# Patient Record
Sex: Male | Born: 1965 | Race: Black or African American | Hispanic: No | Marital: Single | Smoking: Never smoker
Health system: Southern US, Community
[De-identification: ages and names within clinical notes are randomized; demographics above are authoritative.]

## PROBLEM LIST (undated history)

## (undated) DIAGNOSIS — Z789 Other specified health status: Secondary | ICD-10-CM

## (undated) HISTORY — PX: KNEE ARTHROSCOPY: SHX127

---

## 1997-09-20 ENCOUNTER — Emergency Department (HOSPITAL_COMMUNITY): Admission: EM | Admit: 1997-09-20 | Discharge: 1997-09-20 | Payer: Self-pay | Admitting: Emergency Medicine

## 2003-09-23 ENCOUNTER — Emergency Department (HOSPITAL_COMMUNITY): Admission: EM | Admit: 2003-09-23 | Discharge: 2003-09-23 | Payer: Self-pay | Admitting: Emergency Medicine

## 2003-10-30 ENCOUNTER — Emergency Department (HOSPITAL_COMMUNITY): Admission: EM | Admit: 2003-10-30 | Discharge: 2003-10-30 | Payer: Self-pay | Admitting: Family Medicine

## 2015-12-05 DIAGNOSIS — G8929 Other chronic pain: Secondary | ICD-10-CM | POA: Insufficient documentation

## 2017-06-03 ENCOUNTER — Telehealth (INDEPENDENT_AMBULATORY_CARE_PROVIDER_SITE_OTHER): Payer: Self-pay | Admitting: Specialist

## 2017-06-03 NOTE — Telephone Encounter (Signed)
Yes.  Dr. Louanne Skye is out of the office till then.

## 2017-06-03 NOTE — Telephone Encounter (Signed)
Patient states he needed to be soon for his back, he hasnt been seen by another physician for it except he went to urgent care and they just told him his back "blew out" and he had X-rays done there and he said he would bring them. The first available we had was on Monday. Is this okay?

## 2017-06-07 ENCOUNTER — Ambulatory Visit (INDEPENDENT_AMBULATORY_CARE_PROVIDER_SITE_OTHER): Payer: Self-pay | Admitting: Specialist

## 2017-07-05 ENCOUNTER — Ambulatory Visit (INDEPENDENT_AMBULATORY_CARE_PROVIDER_SITE_OTHER): Payer: 59

## 2017-07-05 ENCOUNTER — Encounter (INDEPENDENT_AMBULATORY_CARE_PROVIDER_SITE_OTHER): Payer: Self-pay | Admitting: Specialist

## 2017-07-05 ENCOUNTER — Ambulatory Visit (INDEPENDENT_AMBULATORY_CARE_PROVIDER_SITE_OTHER): Payer: 59 | Admitting: Specialist

## 2017-07-05 VITALS — BP 113/71 | HR 83 | Ht 76.0 in | Wt 230.0 lb

## 2017-07-05 DIAGNOSIS — G8929 Other chronic pain: Secondary | ICD-10-CM | POA: Diagnosis not present

## 2017-07-05 DIAGNOSIS — M545 Low back pain: Secondary | ICD-10-CM

## 2017-07-05 DIAGNOSIS — D171 Benign lipomatous neoplasm of skin and subcutaneous tissue of trunk: Secondary | ICD-10-CM

## 2017-07-05 DIAGNOSIS — M5442 Lumbago with sciatica, left side: Secondary | ICD-10-CM | POA: Diagnosis not present

## 2017-07-05 MED ORDER — MELOXICAM 15 MG PO TABS: 15.0000 mg | ORAL_TABLET | Freq: Every day | ORAL | 6 refills | Status: DC

## 2017-07-05 MED ORDER — CYCLOBENZAPRINE HCL 10 MG PO TABS: 10.0000 mg | ORAL_TABLET | Freq: Three times a day (TID) | ORAL | 0 refills | Status: DC | PRN

## 2017-07-05 NOTE — Patient Instructions (Addendum)
Avoid frequent bending and stooping  No lifting greater than 10 lbs. May use ice or moist heat for pain. Weight loss is of benefit. Meloxicam for antiinflamatory affect for 2 weeks then as needed. Exercise regularly at least 10 min TID. Hold on bicycling for 6 weeks, start a walking program.

## 2017-07-05 NOTE — Progress Notes (Signed)
Office Visit Note   Patient: Billy Stevens           Date of Birth: 1965/07/11           MRN: [id number] Visit Date: 07/05/2017              Requested by: No referring provider defined for this encounter. PCP: Patient, No Pcp Per   Assessment & Plan: Visit Diagnoses:  1. Low back pain, unspecified back pain laterality, unspecified chronicity, with sciatica presence unspecified   2. Chronic bilateral low back pain with left-sided sciatica     Plan: Avoid frequent bending and stooping  No lifting greater than 10 lbs. May use ice or moist heat for pain. Weight loss is of benefit. Meloxicam for antiinflamatory affect for 2 weeks then as needed. Exercise regularly at least 10 min TID. Hold on bicycling for 6 weeks, start a walking program.  Follow-Up Instructions: Return in about 2 months (around 09/02/2017).   Orders:  Orders Placed This Encounter  Procedures  . XR Lumbar Spine 2-3 Views   No orders of the defined types were placed in this encounter.     Procedures: No procedures performed   Clinical Data: No additional findings.   Subjective: Chief Complaint  Patient presents with  . Lower Back - Follow-up    52 year old male with history of back pain and radiation into the right leg. He notes stiffness in the small of his back especially. He has had back go out in the past 2 months ago. He was kept out 3-4 days. Had pain in the back and into the back of his legs. He had got out from truck for the weekend and was going to do some yard work with his cousin. He bent over to pick up some markers or signs and with pain. This past December, with the snow he went to move the snow with his son and felt the pain into his back. Some times with pins and needles in the backs of the legs and into the bottom of the feet. No bowel or bladder difficulty. He can walk okay, in the am he has trouble standing on his foot where He had right foot surgery and right knee surgery  arthroscopy by [facility] Medicine with Dr. Alphonzo Cruise at least 6-7 years ago. 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 He has custody of his son 52 year old male.Numbness comes and goes. He is not sure he can walk a mile. He is grocery shopping. Sitting for periods increase the pain. Leaning over, stooping and bending increase the pain. He has been working out with his son, HS basketball college prospect.     Review of Systems  Constitutional: Positive for activity change and fever. Negative for chills, diaphoresis and unexpected weight change.  HENT: Negative.  Negative for congestion, dental problem, drooling, ear discharge, ear pain, facial swelling, hearing loss, mouth sores and nosebleeds.   Eyes: Negative.   Respiratory: Negative for cough, choking, chest tightness, shortness of breath and stridor.   Cardiovascular: Positive for leg swelling.  Gastrointestinal: Negative.  Negative for abdominal distention, abdominal pain and blood in stool.  Endocrine: Negative.  Negative for cold intolerance, heat intolerance, polydipsia, polyphagia and polyuria.  Genitourinary: Negative.  Negative for difficulty urinating, dysuria, enuresis, flank pain, frequency, genital sores and hematuria.  Musculoskeletal: Positive for arthralgias, back pain, gait problem and joint swelling. Negative for neck pain and neck stiffness.  Skin: Negative.  Negative for color change, pallor, rash and wound.  Allergic/Immunologic: Negative.  Negative for environmental allergies, food allergies and immunocompromised state.  Neurological: Positive for numbness. Negative for dizziness, tremors, seizures, syncope, facial asymmetry, weakness, light-headedness and headaches.  Hematological: Negative.  Negative for adenopathy. Does not bruise/bleed easily.  Psychiatric/Behavioral: Negative.  Negative for agitation, behavioral problems, confusion, decreased concentration, dysphoric mood, hallucinations,  self-injury, sleep disturbance and suicidal ideas. The patient is not nervous/anxious and is not hyperactive.      Objective: Vital Signs: BP 113/71 (BP Location: Left Arm, Patient Position: Sitting)   Pulse 83   Ht 6\' 4"  (1.93 m)   Wt 230 lb (104.3 kg)   BMI 28.00 kg/m   Physical Exam  Constitutional: He is oriented to person, place, and time. He appears well-developed and well-nourished.  HENT:  Head: Normocephalic and atraumatic.  Eyes: EOM are normal. Pupils are equal, round, and reactive to light.  Neck: Normal range of motion. Neck supple.  Pulmonary/Chest: Effort normal and breath sounds normal.  Abdominal: Soft. Bowel sounds are normal.  Neurological: He is alert and oriented to person, place, and time.  Skin: Skin is warm and dry.  Psychiatric: He has a normal mood and affect. His behavior is normal. Judgment and thought content normal.    Back Exam   Tenderness  The patient is experiencing tenderness in the lumbar.  Range of Motion  Flexion: 80  Lateral bend right: abnormal  Lateral bend left: abnormal  Rotation right: abnormal  Rotation left: abnormal   Muscle Strength  Right Quadriceps:  5/5  Left Quadriceps:  5/5  Right Hamstrings:  5/5  Left Hamstrings:  5/5   Tests  Straight leg raise right: negative Straight leg raise left: negative  Reflexes  Patellar:  Hyporeflexic normal Achilles: Hyporeflexic Babinski's sign: normal   Other  Toe walk: normal Heel walk: normal Erythema: no back redness Scars: absent  Comments:  Right paradorsal mass  6CM round, mobile and not fiixed or hard.       Specialty Comments:  No specialty comments available.  Imaging: Xr Lumbar Spine 2-3 Views  Result Date: 07/05/2017 AP and lateral flexion and extension radiographs with minimal degenerative disc narrowing L4-5. Only 4 lumbar vertebra with the transition without a disc space labeled S1-S2    PMFS History: There are no active problems to display for  this patient.  History reviewed. No pertinent past medical history.  History reviewed. No pertinent family history.  History reviewed. No pertinent surgical history. Social History   Occupational History  . Not on file  Tobacco Use  . Smoking status: Never Smoker  . Smokeless tobacco: Never Used  Substance and Sexual Activity  . Alcohol use: Not on file  . Drug use: Not on file  . Sexual activity: Not on file

## 2017-09-02 ENCOUNTER — Ambulatory Visit (INDEPENDENT_AMBULATORY_CARE_PROVIDER_SITE_OTHER): Payer: 59 | Admitting: Specialist

## 2017-09-20 ENCOUNTER — Other Ambulatory Visit (INDEPENDENT_AMBULATORY_CARE_PROVIDER_SITE_OTHER): Payer: Self-pay | Admitting: Specialist

## 2017-10-18 ENCOUNTER — Ambulatory Visit (INDEPENDENT_AMBULATORY_CARE_PROVIDER_SITE_OTHER): Payer: 59

## 2017-10-18 ENCOUNTER — Encounter (INDEPENDENT_AMBULATORY_CARE_PROVIDER_SITE_OTHER): Payer: Self-pay | Admitting: Specialist

## 2017-10-18 ENCOUNTER — Ambulatory Visit (INDEPENDENT_AMBULATORY_CARE_PROVIDER_SITE_OTHER): Payer: 59 | Admitting: Specialist

## 2017-10-18 VITALS — BP 114/76 | HR 58 | Ht 76.0 in | Wt 230.0 lb

## 2017-10-18 DIAGNOSIS — M222X1 Patellofemoral disorders, right knee: Secondary | ICD-10-CM

## 2017-10-18 DIAGNOSIS — M25561 Pain in right knee: Secondary | ICD-10-CM

## 2017-10-18 MED ORDER — DICLOFENAC SODIUM 1 % TD GEL: 4.0000 g | Freq: Four times a day (QID) | TRANSDERMAL | 2 refills | Status: DC

## 2017-10-18 NOTE — Progress Notes (Signed)
Office Visit Note   Patient: Billy Stevens           Date of Birth: 05-15-1966           MRN: [id number] Visit Date: 10/18/2017              Requested by: No referring provider defined for this encounter. PCP: Patient, No Pcp Per   Assessment & Plan: Visit Diagnoses:  1. Right knee pain, unspecified chronicity     Plan: Knee is suffering from osteoarthritis, only real proven treatments are Weight loss, NSIADs like meloxicam,  Diclofenac gel and exercise. Well padded shoes help. Ice the knee 2-3 times a day 15-20 mins at a time.  Follow-Up Instructions: No follow-ups on file.   Orders:  Orders Placed This Encounter  Procedures  . XR Knee 1-2 Views Right   No orders of the defined types were placed in this encounter.     Procedures: No procedures performed   Clinical Data: No additional findings.   Subjective: Chief Complaint  Patient presents with  . Lower Back - Follow-up  . Right Knee - Pain    52 year old male with history of previous right knee arthroscopy by [facility], Dr Durward Fortes when working at [employer] about 10-15 years ago. The foot and right ankle is swollen also. He was seen with back pain and started on meloxicam and flexeril. The right knee is painful to bend and squatting and stair climbing. There is decreased bending on the right knee and it is swollen.    Review of Systems  Constitutional: Negative.   HENT: Negative.   Eyes: Negative.   Respiratory: Negative.   Cardiovascular: Negative.   Gastrointestinal: Negative.   Endocrine: Negative.   Genitourinary: Negative.   Musculoskeletal: Negative.   Skin: Negative.   Allergic/Immunologic: Negative.   Neurological: Negative.   Hematological: Negative.   Psychiatric/Behavioral: Negative.      Objective: Vital Signs: BP 114/76 (BP Location: Left Arm, Patient Position: Sitting)   Pulse (!) 58   Ht 6\' 4"  (1.93 m)   Wt 230 lb (104.3 kg)   BMI 28.00 kg/m   Physical Exam    Constitutional: He is oriented to person, place, and time. He appears well-developed and well-nourished.  HENT:  Head: Normocephalic and atraumatic.  Eyes: Pupils are equal, round, and reactive to light. EOM are normal.  Neck: Normal range of motion. Neck supple.  Pulmonary/Chest: Effort normal and breath sounds normal.  Abdominal: Soft. Bowel sounds are normal.  Musculoskeletal:       Right knee: He exhibits no effusion.  Neurological: He is alert and oriented to person, place, and time.  Skin: Skin is warm and dry.  Psychiatric: He has a normal mood and affect. His behavior is normal. Judgment and thought content normal.    Right Knee Exam   Muscle Strength  The patient has normal right knee strength.  Tenderness  The patient is experiencing tenderness in the patella.  Range of Motion  Extension:  -5 abnormal  Flexion: 130   Tests  McMurray:  Medial - negative Lateral - negative Varus: negative Valgus: negative Lachman:  Anterior - negative    Posterior - negative Drawer:  Anterior - negative    Posterior - negative Pivot shift: negative Patellar apprehension: negative  Other  Erythema: absent Scars: present Sensation: normal Pulse: present Swelling: mild Effusion: no effusion present  Comments:  Right knee 1/4 inch greater in circumference  Specialty Comments:  No specialty comments available.  Imaging: No results found.   PMFS History: There are no active problems to display for this patient.  History reviewed. No pertinent past medical history.  History reviewed. No pertinent family history.  History reviewed. No pertinent surgical history. Social History   Occupational History  . Not on file  Tobacco Use  . Smoking status: Never Smoker  . Smokeless tobacco: Never Used  Substance and Sexual Activity  . Alcohol use: Not on file  . Drug use: Not on file  . Sexual activity: Not on file

## 2017-10-18 NOTE — Patient Instructions (Signed)
  Knee is suffering from osteoarthritis, only real proven treatments are Weight loss, NSIADs like meloxicam,  Diclofenac gel and exercise. Well padded shoes help. Ice the knee 2-3 times a day 15-20 mins at a time.

## 2018-01-03 ENCOUNTER — Ambulatory Visit (INDEPENDENT_AMBULATORY_CARE_PROVIDER_SITE_OTHER): Payer: 59 | Admitting: Specialist

## 2018-02-14 ENCOUNTER — Ambulatory Visit (INDEPENDENT_AMBULATORY_CARE_PROVIDER_SITE_OTHER): Payer: 59 | Admitting: Specialist

## 2018-02-14 ENCOUNTER — Encounter (INDEPENDENT_AMBULATORY_CARE_PROVIDER_SITE_OTHER): Payer: Self-pay | Admitting: Specialist

## 2018-02-14 VITALS — BP 130/88 | HR 81 | Ht 76.0 in | Wt 230.0 lb

## 2018-02-14 DIAGNOSIS — M1711 Unilateral primary osteoarthritis, right knee: Secondary | ICD-10-CM

## 2018-02-14 NOTE — Patient Instructions (Signed)
The main ways of treat osteoarthritis, that are found to be success. Weight loss helps to decrease pain. Exercise is important to maintaining cartilage and thickness and strengthening. NSAIDs like motrin, tylenol, alleve are meds decreasing the inflamation. Ice is okay  In afternoon and evening and hot shower in the am. Straight leg exercises, leg raising.

## 2018-02-14 NOTE — Progress Notes (Signed)
Office Visit Note   Patient: Billy Stevens           Date of Birth: 04-Apr-1966           MRN: [id number] Visit Date: 02/14/2018              Requested by: No referring provider defined for this encounter. PCP: Patient, No Pcp Per   Assessment & Plan: Visit Diagnoses:  1. Patellofemoral arthritis of right knee     Plan:The main ways of treat osteoarthritis, that are found to be success. Weight loss helps to decrease pain. Exercise is important to maintaining cartilage and thickness and strengthening. NSAIDs like motrin, tylenol, alleve are meds decreasing the inflamation. Ice is okay  In afternoon and evening and hot shower in the am. Straight leg exercises, leg raising.   Follow-Up Instructions: Return if symptoms worsen or fail to improve.   Orders:  No orders of the defined types were placed in this encounter.  No orders of the defined types were placed in this encounter.     Procedures: No procedures performed   Clinical Data: No additional findings.   Subjective: Chief Complaint  Patient presents with  . Right Knee - Follow-up    HPI  Review of Systems  Constitutional: Negative.   HENT: Negative.   Eyes: Negative.   Respiratory: Negative.   Cardiovascular: Negative.   Gastrointestinal: Negative.   Endocrine: Negative.   Genitourinary: Negative.   Musculoskeletal: Negative.   Skin: Negative.   Allergic/Immunologic: Negative.   Neurological: Negative.   Hematological: Negative.   Psychiatric/Behavioral: Negative.      Objective: Vital Signs: BP 130/88 (BP Location: Left Arm, Patient Position: Sitting)   Pulse 81   Ht 6\' 4"  (1.93 m)   Wt 230 lb (104.3 kg)   BMI 28.00 kg/m   Physical Exam  Constitutional: He is oriented to person, place, and time. He appears well-developed and well-nourished.  HENT:  Head: Normocephalic and atraumatic.  Eyes: Pupils are equal, round, and reactive to light. EOM are normal.  Neck: Normal range of  motion. Neck supple.  Pulmonary/Chest: Effort normal and breath sounds normal.  Abdominal: Soft. Bowel sounds are normal.  Musculoskeletal: Normal range of motion.  Neurological: He is alert and oriented to person, place, and time.  Skin: Skin is warm and dry.  Psychiatric: He has a normal mood and affect. His behavior is normal. Judgment and thought content normal.    Right Knee Exam   Tenderness  The patient is experiencing no tenderness.   Range of Motion  Extension: normal  Flexion: normal   Tests  McMurray:  Medial - negative Lateral - negative Varus: negative Valgus: negative Lachman:  Anterior - negative    Posterior - negative Drawer:  Anterior - negative    Posterior - negative Pivot shift: negative Patellar apprehension: negative  Other  Erythema: absent Scars: absent Sensation: normal Pulse: present Swelling: none      Specialty Comments:  No specialty comments available.  Imaging: No results found.   PMFS History: There are no active problems to display for this patient.  No past medical history on file.  No family history on file.  No past surgical history on file. Social History   Occupational History  . Not on file  Tobacco Use  . Smoking status: Never Smoker  . Smokeless tobacco: Never Used  Substance and Sexual Activity  . Alcohol use: Not on file  . Drug use: Not on file  .  Sexual activity: Not on file

## 2019-05-22 ENCOUNTER — Emergency Department (HOSPITAL_COMMUNITY)
Admission: EM | Admit: 2019-05-22 | Discharge: 2019-05-22 | Disposition: A | Discharge status: Home / Self Care | Payer: No Typology Code available for payment source | Attending: Emergency Medicine | Admitting: Emergency Medicine

## 2019-05-22 ENCOUNTER — Ambulatory Visit: Payer: Self-pay | Admitting: Surgical

## 2019-05-22 ENCOUNTER — Emergency Department (HOSPITAL_COMMUNITY): Payer: No Typology Code available for payment source

## 2019-05-22 ENCOUNTER — Encounter (HOSPITAL_COMMUNITY): Payer: Self-pay | Admitting: Emergency Medicine

## 2019-05-22 ENCOUNTER — Other Ambulatory Visit: Payer: Self-pay

## 2019-05-22 DIAGNOSIS — M5442 Lumbago with sciatica, left side: Secondary | ICD-10-CM | POA: Insufficient documentation

## 2019-05-22 DIAGNOSIS — M5441 Lumbago with sciatica, right side: Secondary | ICD-10-CM | POA: Insufficient documentation

## 2019-05-22 DIAGNOSIS — M5431 Sciatica, right side: Secondary | ICD-10-CM

## 2019-05-22 DIAGNOSIS — M5432 Sciatica, left side: Secondary | ICD-10-CM

## 2019-05-22 DIAGNOSIS — Z79899 Other long term (current) drug therapy: Secondary | ICD-10-CM | POA: Insufficient documentation

## 2019-05-22 DIAGNOSIS — M545 Low back pain: Secondary | ICD-10-CM | POA: Diagnosis present

## 2019-05-22 MED ORDER — KETAMINE HCL 10 MG/ML IJ SOLN
0.3000 mg/kg | Freq: Once | INTRAMUSCULAR | Status: AC
  Administered 2019-05-22: 30 mg via INTRAVENOUS
  Filled 2019-05-22: qty 1

## 2019-05-22 MED ORDER — DEXAMETHASONE SODIUM PHOSPHATE 10 MG/ML IJ SOLN
10.0000 mg | Freq: Once | INTRAMUSCULAR | Status: AC
  Administered 2019-05-22: 11:00:00 10 mg via INTRAMUSCULAR
  Filled 2019-05-22: qty 1

## 2019-05-22 MED ORDER — HYDROMORPHONE HCL 1 MG/ML IJ SOLN
1.0000 mg | Freq: Once | INTRAMUSCULAR | Status: AC
  Administered 2019-05-22: 1 mg via INTRAVENOUS
  Filled 2019-05-22: qty 1

## 2019-05-22 MED ORDER — HYDROMORPHONE HCL 1 MG/ML IJ SOLN: 1.0000 mg | Freq: Once | INTRAMUSCULAR | Status: DC

## 2019-05-22 MED ORDER — ONDANSETRON 4 MG PO TBDP
4.0000 mg | ORAL_TABLET | Freq: Once | ORAL | Status: AC
  Administered 2019-05-22: 4 mg via ORAL
  Filled 2019-05-22: qty 1

## 2019-05-22 MED ORDER — METHOCARBAMOL 500 MG PO TABS
750.0000 mg | ORAL_TABLET | Freq: Once | ORAL | Status: AC
  Administered 2019-05-22: 750 mg via ORAL
  Filled 2019-05-22: qty 2

## 2019-05-22 MED ORDER — ONDANSETRON HCL 4 MG/2ML IJ SOLN
4.0000 mg | Freq: Once | INTRAMUSCULAR | Status: AC
  Administered 2019-05-22: 4 mg via INTRAVENOUS
  Filled 2019-05-22: qty 2

## 2019-05-22 MED ORDER — METHOCARBAMOL 750 MG PO TABS: 750.0000 mg | ORAL_TABLET | Freq: Three times a day (TID) | ORAL | 0 refills | Status: DC

## 2019-05-22 MED ORDER — HYDROCODONE-ACETAMINOPHEN 5-325 MG PO TABS: 1.0000 | ORAL_TABLET | ORAL | 0 refills | Status: DC | PRN

## 2019-05-22 MED ORDER — PREDNISONE 10 MG PO TABS: ORAL_TABLET | ORAL | 0 refills | Status: DC

## 2019-05-22 MED ORDER — HYDROMORPHONE HCL 1 MG/ML IJ SOLN
1.0000 mg | Freq: Once | INTRAMUSCULAR | Status: AC
  Administered 2019-05-22: 1 mg via INTRAMUSCULAR
  Filled 2019-05-22: qty 1

## 2019-05-22 MED ORDER — ONDANSETRON 4 MG PO TBDP: 4.0000 mg | ORAL_TABLET | Freq: Three times a day (TID) | ORAL | 0 refills | Status: DC | PRN

## 2019-05-22 MED ORDER — METOCLOPRAMIDE HCL 5 MG/ML IJ SOLN
5.0000 mg | Freq: Once | INTRAMUSCULAR | Status: AC
  Administered 2019-05-22: 5 mg via INTRAVENOUS
  Filled 2019-05-22: qty 2

## 2019-05-22 MED ORDER — KETOROLAC TROMETHAMINE 30 MG/ML IJ SOLN
15.0000 mg | Freq: Once | INTRAMUSCULAR | Status: AC
  Administered 2019-05-22: 15 mg via INTRAVENOUS
  Filled 2019-05-22: qty 1

## 2019-05-22 NOTE — ED Triage Notes (Addendum)
Pt brought in via [facility]. Pt reports was at work yesterday when truck began "slipping". Pt reports "went to work on it" and reports back pain ever since. Pt reports "I am not sure if I pulled something." pt reports history of back "swelling". Pt denies any gi/gu, extremity numbness at this time. Pt reports pain with movement and ambulation. nad noted.   Pt reports had appointment with ortho at 1030 but reports pain increased and reports difficulty ambulating and pt family called EMS.

## 2019-05-22 NOTE — ED Notes (Signed)
Patient told RN that he just remembered he is allergic to oxycodone.  Med added to allergy list.

## 2019-05-22 NOTE — ED Notes (Signed)
Patient transported to X-ray 
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
## 2019-05-22 NOTE — Discharge Instructions (Addendum)
Take your next dose of prednisone tomorrow morning.  Use the the other medicines as directed.  Do not drive within 4 hours of taking hydrocodone as this will make you drowsy.  Avoid lifting,  Bending,  Twisting or any other activity that worsens your pain over the next week.  Apply a heating pad to your lower back 3-4 times daily for 20 minutes.  Follow up for a recheck in 4 days as planned.  You have also been prescribed zofran which is a nausea medication.  You may use this if your pain medicine causes nausea for you.

## 2019-06-09 DIAGNOSIS — M545 Low back pain, unspecified: Secondary | ICD-10-CM | POA: Insufficient documentation

## 2019-09-07 ENCOUNTER — Ambulatory Visit: Payer: Managed Care, Other (non HMO)

## 2019-09-09 ENCOUNTER — Ambulatory Visit: Payer: Managed Care, Other (non HMO) | Attending: Internal Medicine

## 2019-09-09 DIAGNOSIS — Z23 Encounter for immunization: Secondary | ICD-10-CM

## 2019-09-09 NOTE — Progress Notes (Signed)
   Covid-19 Vaccination Clinic  Name:  Billy Stevens    MRN: [id number] DOB: 24-Nov-1965  09/09/2019  Billy Stevens was observed post Covid-19 immunization for 15 minutes without incident. He was provided with Vaccine Information Sheet and instruction to access the V-Safe system.   Billy Stevens was instructed to call 911 with any severe reactions post vaccine: Marland Kitchen Difficulty breathing  . Swelling of face and throat  . A fast heartbeat  . A bad rash all over body  . Dizziness and weakness   Immunizations Administered    Name Date Dose VIS Date Route   Pfizer COVID-19 Vaccine 09/09/2019  9:02 AM 0.3 mL 07/12/2018 Intramuscular   Manufacturer: [place]   Lot: [id number]   [place]: [id number]

## 2019-10-07 ENCOUNTER — Ambulatory Visit: Payer: Managed Care, Other (non HMO) | Attending: Internal Medicine

## 2019-10-07 DIAGNOSIS — Z23 Encounter for immunization: Secondary | ICD-10-CM

## 2019-10-07 NOTE — Progress Notes (Signed)
   Covid-19 Vaccination Clinic  Name:  Billy Stevens    MRN: [id number] DOB: 20-Sep-1965  10/07/2019  Mr. Billy Stevens was observed post Covid-19 immunization for 15 minutes without incident. He was provided with Vaccine Information Sheet and instruction to access the V-Safe system.   Mr. Billy Stevens was instructed to call 911 with any severe reactions post vaccine: Marland Kitchen Difficulty breathing  . Swelling of face and throat  . A fast heartbeat  . A bad rash all over body  . Dizziness and weakness   Immunizations Administered    Name Date Dose VIS Date Route   Pfizer COVID-19 Vaccine 10/07/2019  9:01 AM 0.3 mL 07/12/2018 Intramuscular   Manufacturer: [employer]   Lot: [id number]   [place]: [id number]

## 2021-02-18 IMAGING — DX DG LUMBAR SPINE COMPLETE 4+V
5 series · 5 of 5 positions shown · non-contrast
Comparison: 07/05/2017

CLINICAL DATA: Low back pain

EXAM:
LUMBAR SPINE - COMPLETE 4+ VIEW

[l-spine ap]
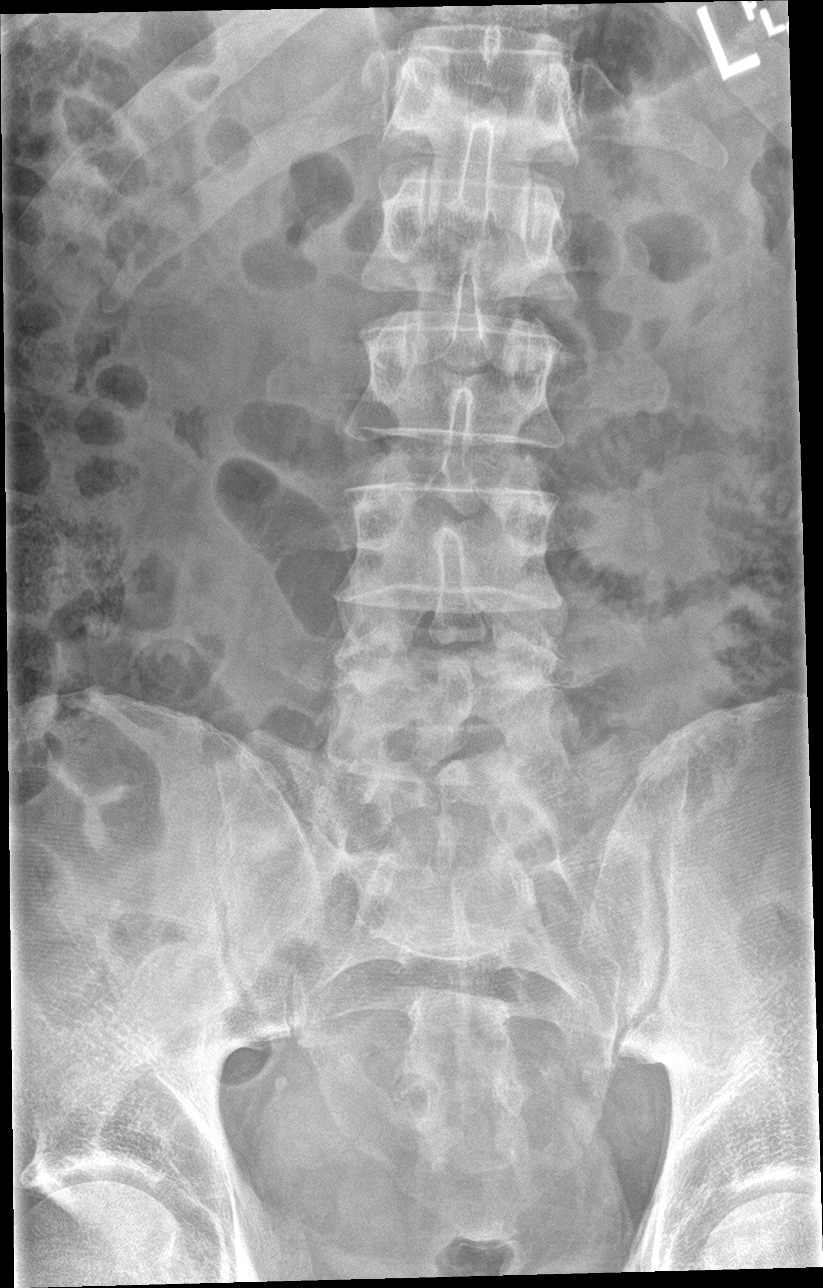

[l-spine obl (1 of 2)]
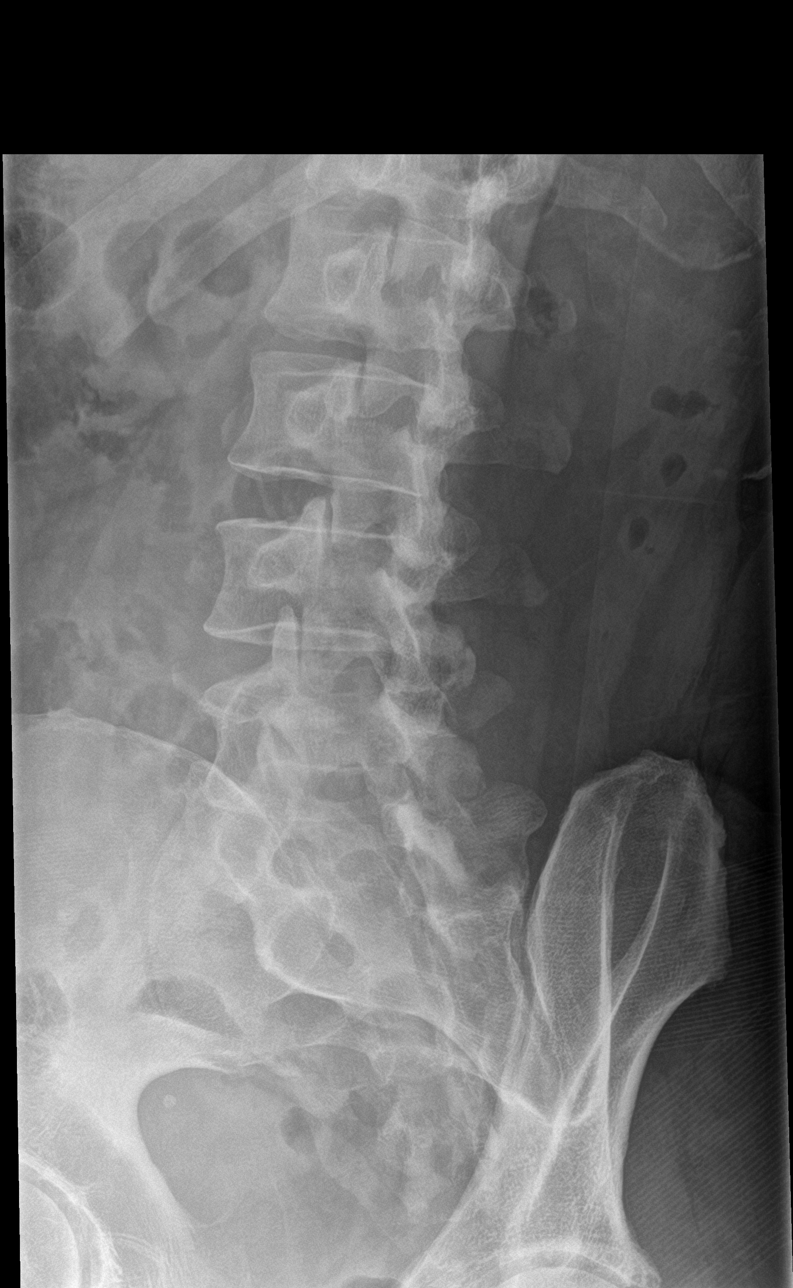

[l-spine lat]
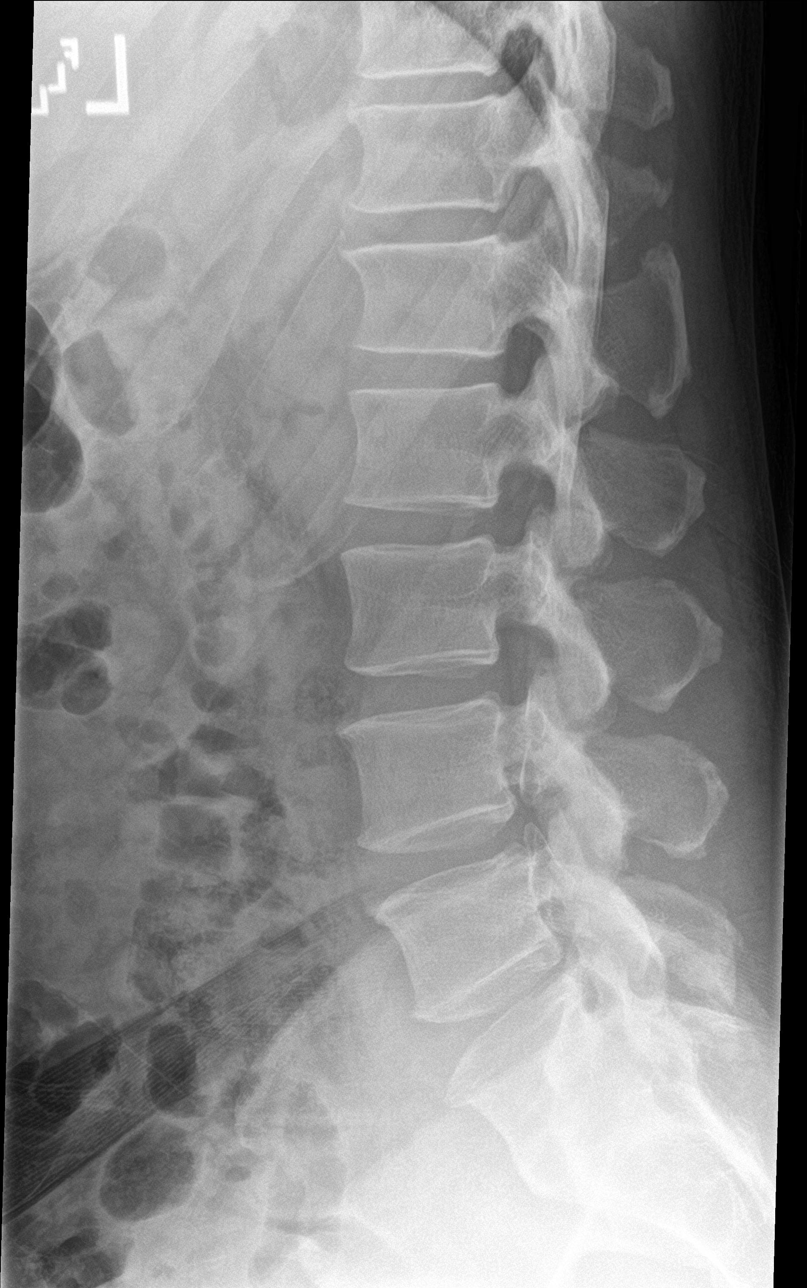

[l-spine spot]
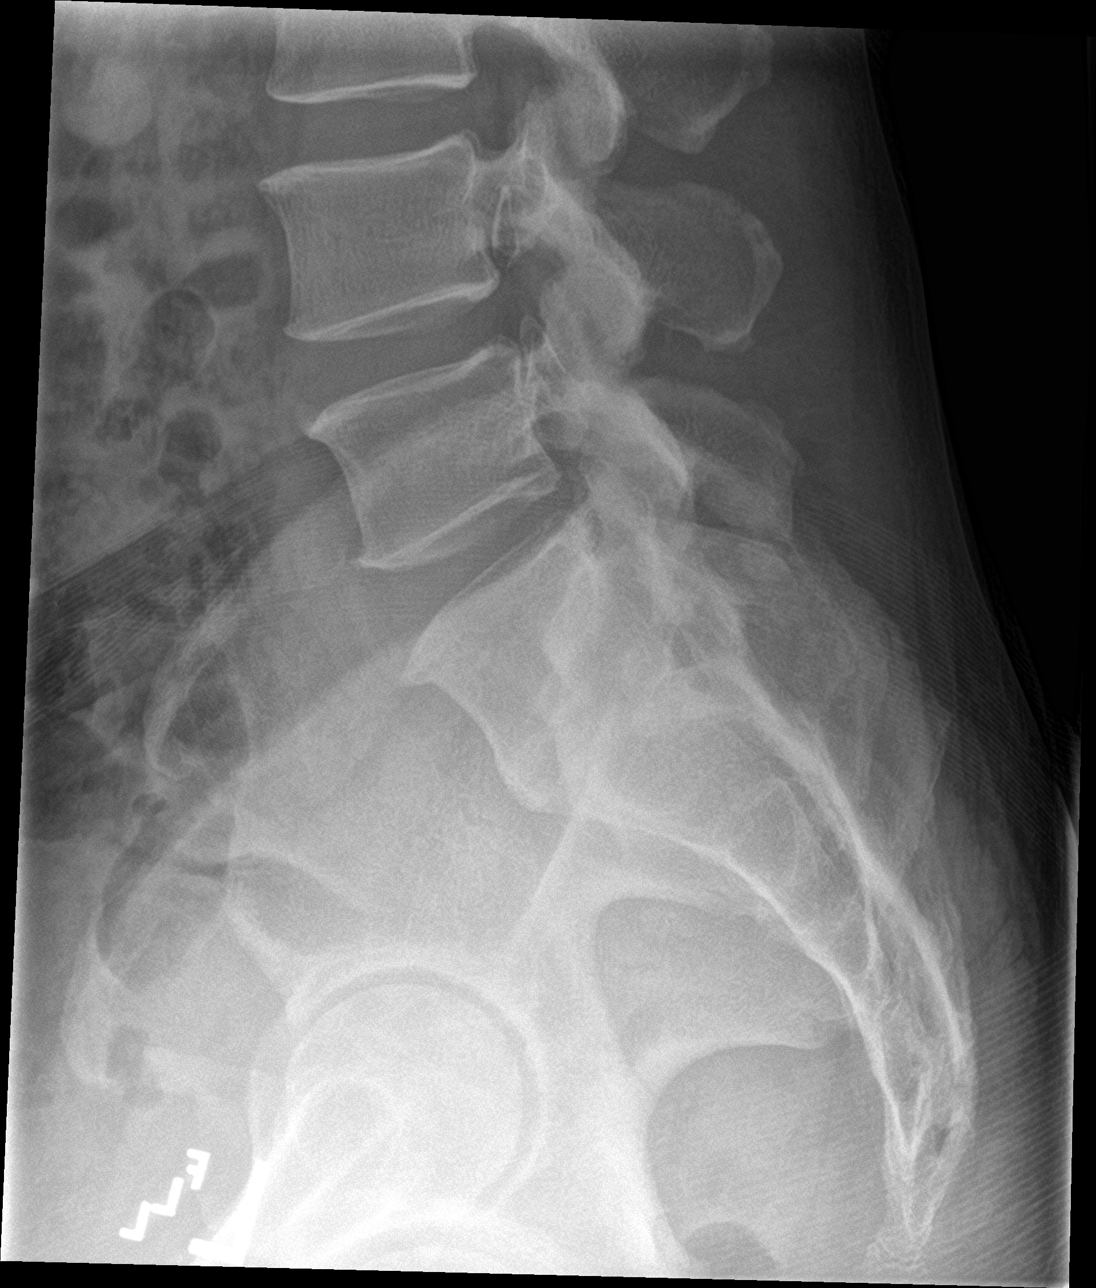

[l-spine obl (2 of 2)]
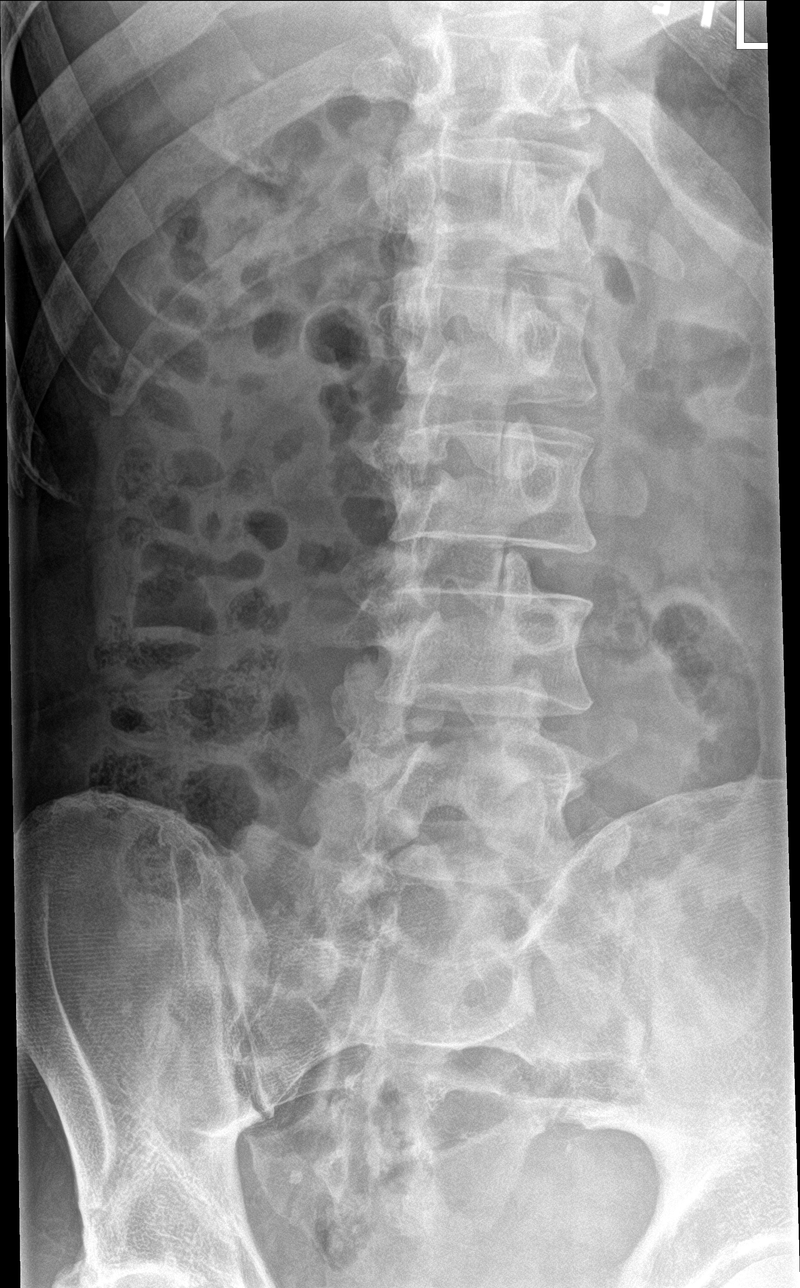

[5 of 5 positions shown; findings below may reference images not displayed]

FINDINGS: Transitional lumbosacral anatomy with sacralization of the L5
segment. Intervertebral disc spaces are preserved. Minimal
degenerative endplate spurring. Mild lower lumbar facet arthrosis
without evidence of significant progression from the prior study.
Vertebral body heights are maintained without evidence of fracture.
IMPRESSION: 1. No acute findings.
2. Transitional lumbosacral anatomy with sacralization of the L5
segment.
3. Mild lower lumbar facet arthrosis.

## 2021-05-28 ENCOUNTER — Other Ambulatory Visit (HOSPITAL_BASED_OUTPATIENT_CLINIC_OR_DEPARTMENT_OTHER): Payer: Self-pay

## 2021-11-13 ENCOUNTER — Ambulatory Visit: Payer: Self-pay | Admitting: Orthopedic Surgery

## 2021-11-21 NOTE — Pre-Procedure Instructions (Signed)
Surgical Instructions    Your procedure is scheduled on Thursday, July 13th.  Report to [facility] Main Entrance "A" at 5:30 A.M., then check in with the Admitting office.  Call this number if you have problems the morning of surgery:  [phone]   If you have any questions prior to your surgery date call [phone]: Open Monday-Friday 8am-4pm    Remember:  Do not eat after midnight the night before your surgery  You may drink clear liquids until 4:30 a.m. the morning of your surgery.   Clear liquids allowed are: Water, Non-Citrus Juices (without pulp), Carbonated Beverages, Clear Tea, Black Coffee Only (NO MILK, CREAM OR POWDERED CREAMER of any kind), and Gatorade.    Take these medicines the morning of surgery with A SIP OF WATER: NONE  As of today, STOP taking any Aspirin (unless otherwise instructed by your surgeon) Aleve, Naproxen, Ibuprofen, Motrin, Advil, Goody's, BC's, all herbal medications, fish oil, and all vitamins. This includes: meloxicam (MOBIC).                     Do NOT Smoke (Tobacco/Vaping) for 24 hours prior to your procedure.  If you use a CPAP at night, you may bring your mask/headgear for your overnight stay.   Contacts, glasses, piercing's, hearing aid's, dentures or partials may not be worn into surgery, please bring cases for these belongings.    For patients admitted to the hospital, discharge time will be determined by your treatment team.   Patients discharged the day of surgery will not be allowed to drive home, and someone needs to stay with them for 24 hours.  SURGICAL WAITING ROOM VISITATION Patients having surgery or a procedure may have two support people in the waiting room. These visitors may be switched out with other visitors if needed. Children under the age of 20 must have an adult accompany them who is not the patient. If the patient needs to stay at the hospital during part of their recovery, the visitor guidelines for inpatient rooms  apply.  Please refer to the [facility] website for the visitor guidelines for Inpatients (after your surgery is over and you are in a regular room).    Special instructions:   [place]- Preparing For Surgery  Before surgery, you can play an important role. Because skin is not sterile, your skin needs to be as free of germs as possible. You can reduce the number of germs on your skin by washing with CHG (chlorahexidine gluconate) Soap before surgery.  CHG is an antiseptic cleaner which kills germs and bonds with the skin to continue killing germs even after washing.    Oral Hygiene is also important to reduce your risk of infection.  Remember - BRUSH YOUR TEETH THE MORNING OF SURGERY WITH YOUR REGULAR TOOTHPASTE  Please do not use if you have an allergy to CHG or antibacterial soaps. If your skin becomes reddened/irritated stop using the CHG.  Do not shave (including legs and underarms) for at least 48 hours prior to first CHG shower. It is OK to shave your face.  Please follow these instructions carefully.   Shower the NIGHT BEFORE SURGERY and the MORNING OF SURGERY  If you chose to wash your hair, wash your hair first as usual with your normal shampoo.  After you shampoo, rinse your hair and body thoroughly to remove the shampoo.  Use CHG Soap as you would any other liquid soap. You can apply CHG directly to the skin  and wash gently with a scrungie or a clean washcloth.   Apply the CHG Soap to your body ONLY FROM THE NECK DOWN.  Do not use on open wounds or open sores. Avoid contact with your eyes, ears, mouth and genitals (private parts). Wash Face and genitals (private parts)  with your normal soap.   Wash thoroughly, paying special attention to the area where your surgery will be performed.  Thoroughly rinse your body with warm water from the neck down.  DO NOT shower/wash with your normal soap after using and rinsing off the CHG Soap.  Pat yourself dry with a CLEAN  TOWEL.  Wear CLEAN PAJAMAS to bed the night before surgery  Place CLEAN SHEETS on your bed the night before your surgery  DO NOT SLEEP WITH PETS.   Day of Surgery: Take a shower with CHG soap. Do not wear jewelry or makeup Do not wear lotions, powders, colognes, or deodorant. Men may shave face and neck. Do not bring valuables to the hospital.  [facility] is not responsible for any belongings or valuables. Wear Clean/Comfortable clothing the morning of surgery Remember to brush your teeth WITH YOUR REGULAR TOOTHPASTE.   Please read over the following fact sheets that you were given.    If you received a COVID test during your pre-op visit  it is requested that you wear a mask when out in public, stay away from anyone that may not be feeling well and notify your surgeon if you develop symptoms. If you have been in contact with anyone that has tested positive in the last 10 days please notify you surgeon.

## 2021-11-24 ENCOUNTER — Encounter (HOSPITAL_COMMUNITY): Payer: Self-pay

## 2021-11-24 ENCOUNTER — Encounter (HOSPITAL_COMMUNITY)
Admission: RE | Admit: 2021-11-24 | Discharge: 2021-11-24 | Disposition: A | Discharge status: Home / Self Care | Payer: Managed Care, Other (non HMO) | Source: Ambulatory Visit | Attending: Orthopedic Surgery | Admitting: Orthopedic Surgery

## 2021-11-24 ENCOUNTER — Other Ambulatory Visit: Payer: Self-pay

## 2021-11-24 VITALS — BP 145/90 | HR 75 | Temp 98.1°F | Resp 17 | Ht 76.0 in | Wt 242.0 lb

## 2021-11-24 DIAGNOSIS — Z01818 Encounter for other preprocedural examination: Secondary | ICD-10-CM

## 2021-11-24 DIAGNOSIS — Z01812 Encounter for preprocedural laboratory examination: Secondary | ICD-10-CM | POA: Diagnosis not present

## 2021-11-24 HISTORY — DX: Other specified health status: Z78.9

## 2021-11-24 LAB — CBC
HCT: 42.5 % (ref 39.0–52.0)
Hemoglobin: 13.6 g/dL (ref 13.0–17.0)
MCH: 27.4 pg (ref 26.0–34.0)
MCHC: 32 g/dL (ref 30.0–36.0)
MCV: 85.7 fL (ref 80.0–100.0)
Platelets: 306 10*3/uL (ref 150–400)
RBC: 4.96 MIL/uL (ref 4.22–5.81)
RDW: 13.8 % (ref 11.5–15.5)
WBC: 4.9 10*3/uL (ref 4.0–10.5)
nRBC: 0 % (ref 0.0–0.2)

## 2021-11-24 LAB — SURGICAL PCR SCREEN
MRSA, PCR: NEGATIVE
Staphylococcus aureus: NEGATIVE

## 2021-11-24 NOTE — Progress Notes (Signed)
PCP - Everardo Beals, NP Cardiologist - Denies  PPM/ICD - Denies Device Orders - n/a Rep Notified - n/a  Chest x-ray - Denies EKG - Denies Stress Test - Denies ECHO - Denies Cardiac Cath - Denies  Sleep Study - Denies CPAP - n/a  No DM  Blood Thinner Instructions: n/a Aspirin Instructions: n/a  ERAS Protcol - may have clear liquids until 0430 the morning of surgery PRE-SURGERY Ensure or G2- none ordered  COVID TEST- n/a   Anesthesia review: No   Patient denies shortness of breath, fever, cough and chest pain at PAT appointment   All instructions explained to the patient, with a verbal understanding of the material. Patient agrees to go over the instructions while at home for a better understanding. Patient also instructed to self quarantine after being tested for COVID-19. The opportunity to ask questions was provided.

## 2021-11-26 NOTE — Anesthesia Preprocedure Evaluation (Addendum)
Anesthesia Evaluation  Patient identified by MRN, date of birth, ID band Patient awake    Reviewed: Allergy & Precautions, NPO status , Patient's Chart, lab work & pertinent test results  Airway Mallampati: II  TM Distance: >3 FB Neck ROM: Full    Dental no notable dental hx. (+) Teeth Intact, Dental Advisory Given   Pulmonary    Pulmonary exam normal breath sounds clear to auscultation       Cardiovascular Exercise Tolerance: Good Normal cardiovascular exam Rhythm:Regular Rate:Normal     Neuro/Psych negative neurological ROS     GI/Hepatic negative GI ROS, Neg liver ROS,   Endo/Other    Renal/GU negative Renal ROS     Musculoskeletal   Abdominal   Peds  Hematology Lab Results      Component                Value               Date                      WBC                      4.9                 11/24/2021                HGB                      13.6                11/24/2021                HCT                      42.5                11/24/2021                MCV                      85.7                11/24/2021                PLT                      306                 11/24/2021              Anesthesia Other Findings ALL: oxycodone  Reproductive/Obstetrics                            Anesthesia Physical Anesthesia Plan  ASA: 1  Anesthesia Plan: General   Post-op Pain Management: Ketamine IV*, Gabapentin PO (pre-op)*, Dilaudid IV and Tylenol PO (pre-op)*   Induction: Intravenous  PONV Risk Score and Plan: 3 and Treatment may vary due to age or medical condition, Midazolam and Ondansetron  Airway Management Planned: Oral ETT  Additional Equipment: None  Intra-op Plan:   Post-operative Plan: Extubation in OR  Informed Consent: I have reviewed the patients History and Physical, chart, labs and discussed the procedure including the risks, benefits and alternatives for the  proposed anesthesia with the patient or authorized representative who has  indicated his/her understanding and acceptance.     Dental advisory given  Plan Discussed with:   Anesthesia Plan Comments:        Anesthesia Quick Evaluation

## 2021-11-27 ENCOUNTER — Other Ambulatory Visit: Payer: Self-pay

## 2021-11-27 ENCOUNTER — Ambulatory Visit (HOSPITAL_BASED_OUTPATIENT_CLINIC_OR_DEPARTMENT_OTHER): Payer: Managed Care, Other (non HMO) | Admitting: Certified Registered"

## 2021-11-27 ENCOUNTER — Ambulatory Visit (HOSPITAL_COMMUNITY)
Admission: RE | Admit: 2021-11-27 | Discharge: 2021-11-27 | Disposition: A | Discharge status: Home / Self Care | Payer: Managed Care, Other (non HMO) | Attending: Orthopedic Surgery | Admitting: Orthopedic Surgery

## 2021-11-27 ENCOUNTER — Encounter (HOSPITAL_COMMUNITY)
Admission: RE | Disposition: A | Discharge status: Home / Self Care | Payer: Self-pay | Source: Home / Self Care | Attending: Orthopedic Surgery

## 2021-11-27 ENCOUNTER — Encounter (HOSPITAL_COMMUNITY): Payer: Self-pay | Admitting: Orthopedic Surgery

## 2021-11-27 ENCOUNTER — Ambulatory Visit (HOSPITAL_COMMUNITY): Payer: Managed Care, Other (non HMO)

## 2021-11-27 ENCOUNTER — Ambulatory Visit (HOSPITAL_COMMUNITY): Payer: Managed Care, Other (non HMO) | Admitting: Certified Registered"

## 2021-11-27 DIAGNOSIS — M5116 Intervertebral disc disorders with radiculopathy, lumbar region: Secondary | ICD-10-CM | POA: Insufficient documentation

## 2021-11-27 HISTORY — PX: LUMBAR LAMINECTOMY/DECOMPRESSION MICRODISCECTOMY: SHX5026

## 2021-11-27 SURGERY — LUMBAR LAMINECTOMY/DECOMPRESSION MICRODISCECTOMY 1 LEVEL
Anesthesia: General | Site: Spine Lumbar | Laterality: Right

## 2021-11-27 MED ORDER — SURGIFLO WITH THROMBIN (HEMOSTATIC MATRIX KIT) OPTIME
TOPICAL | Status: DC | PRN
  Administered 2021-11-27: 1 via TOPICAL

## 2021-11-27 MED ORDER — ACETAMINOPHEN 10 MG/ML IV SOLN: 1000.0000 mg | Freq: Once | INTRAVENOUS | Status: DC | PRN

## 2021-11-27 MED ORDER — THROMBIN 20000 UNITS EX SOLR
CUTANEOUS | Status: AC
Start: 2021-11-27 — End: ?
  Filled 2021-11-27: qty 20000

## 2021-11-27 MED ORDER — AMISULPRIDE (ANTIEMETIC) 5 MG/2ML IV SOLN: 10.0000 mg | Freq: Once | INTRAVENOUS | Status: DC | PRN

## 2021-11-27 MED ORDER — METHOCARBAMOL 500 MG PO TABS: 500.0000 mg | ORAL_TABLET | Freq: Three times a day (TID) | ORAL | 0 refills | Status: AC | PRN

## 2021-11-27 MED ORDER — PROPOFOL 10 MG/ML IV BOLUS
INTRAVENOUS | Status: DC | PRN
  Administered 2021-11-27: 170 mg via INTRAVENOUS
  Administered 2021-11-27: 30 mg via INTRAVENOUS

## 2021-11-27 MED ORDER — BUPIVACAINE-EPINEPHRINE (PF) 0.25% -1:200000 IJ SOLN
INTRAMUSCULAR | Status: DC | PRN
  Administered 2021-11-27: 30 mL

## 2021-11-27 MED ORDER — DEXAMETHASONE SODIUM PHOSPHATE 10 MG/ML IJ SOLN
INTRAMUSCULAR | Status: AC
  Filled 2021-11-27: qty 1

## 2021-11-27 MED ORDER — 0.9 % SODIUM CHLORIDE (POUR BTL) OPTIME
TOPICAL | Status: DC | PRN
  Administered 2021-11-27: 1000 mL

## 2021-11-27 MED ORDER — EPHEDRINE SULFATE-NACL 50-0.9 MG/10ML-% IV SOSY
PREFILLED_SYRINGE | INTRAVENOUS | Status: DC | PRN
  Administered 2021-11-27: 10 mg via INTRAVENOUS

## 2021-11-27 MED ORDER — HYDROMORPHONE HCL 1 MG/ML IJ SOLN
INTRAMUSCULAR | Status: AC
  Filled 2021-11-27: qty 1

## 2021-11-27 MED ORDER — ROCURONIUM BROMIDE 10 MG/ML (PF) SYRINGE
PREFILLED_SYRINGE | INTRAVENOUS | Status: DC | PRN
  Administered 2021-11-27: 80 mg via INTRAVENOUS

## 2021-11-27 MED ORDER — PHENYLEPHRINE HCL-NACL 20-0.9 MG/250ML-% IV SOLN
INTRAVENOUS | Status: DC | PRN
  Administered 2021-11-27: 30 ug/min via INTRAVENOUS

## 2021-11-27 MED ORDER — DEXAMETHASONE SODIUM PHOSPHATE 10 MG/ML IJ SOLN
INTRAMUSCULAR | Status: DC | PRN
  Administered 2021-11-27: 10 mg via INTRAVENOUS

## 2021-11-27 MED ORDER — ONDANSETRON HCL 4 MG/2ML IJ SOLN
4.0000 mg | Freq: Once | INTRAMUSCULAR | Status: DC | PRN
Start: 2021-11-27 — End: 2021-11-27

## 2021-11-27 MED ORDER — OXYCODONE-ACETAMINOPHEN 10-325 MG PO TABS: 1.0000 | ORAL_TABLET | Freq: Four times a day (QID) | ORAL | 0 refills | Status: AC | PRN

## 2021-11-27 MED ORDER — METHYLPREDNISOLONE ACETATE 40 MG/ML IJ SUSP
INTRAMUSCULAR | Status: AC
  Filled 2021-11-27: qty 1

## 2021-11-27 MED ORDER — LIDOCAINE 2% (20 MG/ML) 5 ML SYRINGE
INTRAMUSCULAR | Status: DC | PRN
  Administered 2021-11-27: 100 mg via INTRAVENOUS

## 2021-11-27 MED ORDER — ROCURONIUM BROMIDE 10 MG/ML (PF) SYRINGE
PREFILLED_SYRINGE | INTRAVENOUS | Status: AC
  Filled 2021-11-27: qty 10

## 2021-11-27 MED ORDER — BUPIVACAINE-EPINEPHRINE 0.25% -1:200000 IJ SOLN
INTRAMUSCULAR | Status: DC | PRN
  Administered 2021-11-27: 10 mL

## 2021-11-27 MED ORDER — KETAMINE HCL 50 MG/5ML IJ SOSY
PREFILLED_SYRINGE | INTRAMUSCULAR | Status: AC
  Filled 2021-11-27: qty 5

## 2021-11-27 MED ORDER — SUFENTANIL CITRATE 50 MCG/ML IV SOLN
INTRAVENOUS | Status: AC
  Filled 2021-11-27: qty 1

## 2021-11-27 MED ORDER — PHENYLEPHRINE 80 MCG/ML (10ML) SYRINGE FOR IV PUSH (FOR BLOOD PRESSURE SUPPORT)
PREFILLED_SYRINGE | INTRAVENOUS | Status: DC | PRN
  Administered 2021-11-27 (×4): 160 ug via INTRAVENOUS

## 2021-11-27 MED ORDER — ONDANSETRON HCL 4 MG PO TABS: 4.0000 mg | ORAL_TABLET | Freq: Three times a day (TID) | ORAL | 0 refills | Status: DC | PRN

## 2021-11-27 MED ORDER — HYDROMORPHONE HCL 1 MG/ML IJ SOLN
0.2500 mg | INTRAMUSCULAR | Status: DC | PRN
  Administered 2021-11-27 (×2): 0.5 mg via INTRAVENOUS

## 2021-11-27 MED ORDER — METHYLPREDNISOLONE ACETATE 40 MG/ML INJ SUSP (RADIOLOG
INTRAMUSCULAR | Status: DC | PRN
  Administered 2021-11-27: 40 mg

## 2021-11-27 MED ORDER — TRANEXAMIC ACID 1000 MG/10ML IV SOLN
2000.0000 mg | Freq: Once | INTRAVENOUS | Status: AC
  Administered 2021-11-27: 2000 mg via TOPICAL
  Filled 2021-11-27: qty 20

## 2021-11-27 MED ORDER — LACTATED RINGERS IV SOLN: INTRAVENOUS | Status: DC

## 2021-11-27 MED ORDER — CEFAZOLIN SODIUM-DEXTROSE 2-4 GM/100ML-% IV SOLN
2.0000 g | INTRAVENOUS | Status: AC
  Administered 2021-11-27: 2 g via INTRAVENOUS
  Filled 2021-11-27: qty 100

## 2021-11-27 MED ORDER — PROPOFOL 10 MG/ML IV BOLUS
INTRAVENOUS | Status: AC
  Filled 2021-11-27: qty 20

## 2021-11-27 MED ORDER — EPHEDRINE 5 MG/ML INJ
INTRAVENOUS | Status: AC
  Filled 2021-11-27: qty 5

## 2021-11-27 MED ORDER — CHLORHEXIDINE GLUCONATE 0.12 % MT SOLN
15.0000 mL | Freq: Once | OROMUCOSAL | Status: AC
  Administered 2021-11-27: 15 mL via OROMUCOSAL
  Filled 2021-11-27: qty 15

## 2021-11-27 MED ORDER — MIDAZOLAM HCL 2 MG/2ML IJ SOLN
INTRAMUSCULAR | Status: DC | PRN
  Administered 2021-11-27: 2 mg via INTRAVENOUS

## 2021-11-27 MED ORDER — THROMBIN 20000 UNITS EX SOLR
CUTANEOUS | Status: DC | PRN
  Administered 2021-11-27: 20 mL via TOPICAL

## 2021-11-27 MED ORDER — OXYCODONE HCL 5 MG/5ML PO SOLN: 5.0000 mg | Freq: Once | ORAL | Status: DC | PRN

## 2021-11-27 MED ORDER — ONDANSETRON HCL 4 MG/2ML IJ SOLN
INTRAMUSCULAR | Status: AC
  Filled 2021-11-27: qty 2

## 2021-11-27 MED ORDER — ONDANSETRON HCL 4 MG/2ML IJ SOLN
INTRAMUSCULAR | Status: DC | PRN
  Administered 2021-11-27: 4 mg via INTRAVENOUS

## 2021-11-27 MED ORDER — PHENYLEPHRINE 80 MCG/ML (10ML) SYRINGE FOR IV PUSH (FOR BLOOD PRESSURE SUPPORT)
PREFILLED_SYRINGE | INTRAVENOUS | Status: AC
  Filled 2021-11-27: qty 10

## 2021-11-27 MED ORDER — ORAL CARE MOUTH RINSE: 15.0000 mL | Freq: Once | OROMUCOSAL | Status: AC

## 2021-11-27 MED ORDER — BUPIVACAINE-EPINEPHRINE (PF) 0.25% -1:200000 IJ SOLN
INTRAMUSCULAR | Status: AC
  Filled 2021-11-27: qty 30

## 2021-11-27 MED ORDER — SODIUM CHLORIDE (PF) 0.9 % IJ SOLN
INTRAMUSCULAR | Status: AC
  Filled 2021-11-27: qty 10

## 2021-11-27 MED ORDER — SUFENTANIL CITRATE 50 MCG/ML IV SOLN
INTRAVENOUS | Status: DC | PRN
  Administered 2021-11-27: 20 ug via INTRAVENOUS

## 2021-11-27 MED ORDER — LIDOCAINE 2% (20 MG/ML) 5 ML SYRINGE
INTRAMUSCULAR | Status: AC
  Filled 2021-11-27: qty 5

## 2021-11-27 MED ORDER — MIDAZOLAM HCL 2 MG/2ML IJ SOLN
INTRAMUSCULAR | Status: AC
  Filled 2021-11-27: qty 2

## 2021-11-27 MED ORDER — KETAMINE HCL 10 MG/ML IJ SOLN
INTRAMUSCULAR | Status: DC | PRN
  Administered 2021-11-27: 20 mg via INTRAVENOUS

## 2021-11-27 MED ORDER — OXYCODONE HCL 5 MG PO TABS: 5.0000 mg | ORAL_TABLET | Freq: Once | ORAL | Status: DC | PRN

## 2021-11-27 SURGICAL SUPPLY — 63 items
AGENT HMST KT MTR STRL THRMB (HEMOSTASIS)
BAG COUNTER SPONGE SURGICOUNT (BAG) ×3 IMPLANT
BAG DECANTER FOR FLEXI CONT (MISCELLANEOUS) ×1 IMPLANT
BAG SPNG CNTER NS LX DISP (BAG) ×1
BNDG GAUZE ELAST 4 BULKY (GAUZE/BANDAGES/DRESSINGS) ×3 IMPLANT
CANISTER SUCT 3000ML PPV (MISCELLANEOUS) ×3 IMPLANT
CLSR STERI-STRIP ANTIMIC 1/2X4 (GAUZE/BANDAGES/DRESSINGS) ×3 IMPLANT
CORD BIPOLAR FORCEPS 12FT (ELECTRODE) ×3 IMPLANT
COVER SURGICAL LIGHT HANDLE (MISCELLANEOUS) ×3 IMPLANT
DRAIN CHANNEL 15F RND FF W/TCR (WOUND CARE) IMPLANT
DRAPE SURG 17X23 STRL (DRAPES) ×3 IMPLANT
DRAPE U-SHAPE 47X51 STRL (DRAPES) ×3 IMPLANT
DRSG OPSITE POSTOP 3X4 (GAUZE/BANDAGES/DRESSINGS) ×3 IMPLANT
DRSG OPSITE POSTOP 4X6 (GAUZE/BANDAGES/DRESSINGS) ×1 IMPLANT
DURAPREP 26ML APPLICATOR (WOUND CARE) ×3 IMPLANT
ELECT BLADE 4.0 EZ CLEAN MEGAD (MISCELLANEOUS) ×2
ELECT CAUTERY BLADE 6.4 (BLADE) ×2 IMPLANT
ELECT PENCIL ROCKER SW 15FT (MISCELLANEOUS) ×3 IMPLANT
ELECT REM PT RETURN 9FT ADLT (ELECTROSURGICAL) ×2
ELECTRODE BLDE 4.0 EZ CLN MEGD (MISCELLANEOUS) IMPLANT
ELECTRODE REM PT RTRN 9FT ADLT (ELECTROSURGICAL) ×2 IMPLANT
EVACUATOR SILICONE 100CC (DRAIN) IMPLANT
GLOVE BIO SURGEON STRL SZ 6.5 (GLOVE) ×3 IMPLANT
GLOVE BIOGEL PI IND STRL 6.5 (GLOVE) ×2 IMPLANT
GLOVE BIOGEL PI IND STRL 8.5 (GLOVE) ×2 IMPLANT
GLOVE BIOGEL PI INDICATOR 6.5 (GLOVE) ×1
GLOVE BIOGEL PI INDICATOR 8.5 (GLOVE) ×1
GLOVE SS BIOGEL STRL SZ 8.5 (GLOVE) ×2 IMPLANT
GLOVE SUPERSENSE BIOGEL SZ 8.5 (GLOVE) ×1
GOWN STRL REUS W/ TWL LRG LVL3 (GOWN DISPOSABLE) ×4 IMPLANT
GOWN STRL REUS W/TWL 2XL LVL3 (GOWN DISPOSABLE) ×3 IMPLANT
GOWN STRL REUS W/TWL LRG LVL3 (GOWN DISPOSABLE) ×4
KIT BASIN OR (CUSTOM PROCEDURE TRAY) ×3 IMPLANT
KIT TURNOVER KIT B (KITS) ×3 IMPLANT
NDL 18GX1X1/2 (RX/OR ONLY) (NEEDLE) IMPLANT
NDL SPNL 18GX3.5 QUINCKE PK (NEEDLE) ×4 IMPLANT
NEEDLE 18GX1X1/2 (RX/OR ONLY) (NEEDLE) ×2 IMPLANT
NEEDLE 22X1 1/2 (OR ONLY) (NEEDLE) ×3 IMPLANT
NEEDLE SPNL 18GX3.5 QUINCKE PK (NEEDLE) ×4 IMPLANT
NS IRRIG 1000ML POUR BTL (IV SOLUTION) ×4 IMPLANT
PACK LAMINECTOMY ORTHO (CUSTOM PROCEDURE TRAY) ×3 IMPLANT
PACK UNIVERSAL I (CUSTOM PROCEDURE TRAY) ×3 IMPLANT
PAD ARMBOARD 7.5X6 YLW CONV (MISCELLANEOUS) ×7 IMPLANT
PATTIES SURGICAL .5 X.5 (GAUZE/BANDAGES/DRESSINGS) ×2 IMPLANT
PATTIES SURGICAL .5 X1 (DISPOSABLE) ×3 IMPLANT
SPONGE SURGIFOAM ABS GEL 100 (HEMOSTASIS) ×1 IMPLANT
SPONGE T-LAP 4X18 ~~LOC~~+RFID (SPONGE) ×7 IMPLANT
SURGIFLO W/THROMBIN 8M KIT (HEMOSTASIS) IMPLANT
SUT BONE WAX W31G (SUTURE) ×3 IMPLANT
SUT MNCRL+ AB 3-0 CT1 36 (SUTURE) ×2 IMPLANT
SUT MONOCRYL AB 3-0 CT1 36IN (SUTURE) ×2
SUT VIC AB 0 CT1 27 (SUTURE) ×2
SUT VIC AB 0 CT1 27XBRD ANBCTR (SUTURE) IMPLANT
SUT VIC AB 1 CT1 18XCR BRD 8 (SUTURE) ×2 IMPLANT
SUT VIC AB 1 CT1 8-18 (SUTURE) ×2
SUT VIC AB 2-0 CT1 18 (SUTURE) ×3 IMPLANT
SYR BULB IRRIG 60ML STRL (SYRINGE) ×3 IMPLANT
SYR CONTROL 10ML LL (SYRINGE) ×3 IMPLANT
SYR TB 1ML LUER SLIP (SYRINGE) ×1 IMPLANT
TOWEL GREEN STERILE (TOWEL DISPOSABLE) ×3 IMPLANT
TOWEL GREEN STERILE FF (TOWEL DISPOSABLE) ×3 IMPLANT
WATER STERILE IRR 1000ML POUR (IV SOLUTION) ×2 IMPLANT
YANKAUER SUCT BULB TIP NO VENT (SUCTIONS) ×1 IMPLANT

## 2021-11-27 NOTE — Progress Notes (Signed)
   11/27/21 1133  TOC Assessment  TOC screening is complete Yes  Expected Discharge Plan Home/Self Care  Final next level of care Home/Self Care  Barriers to Discharge Barriers Resolved  Patient states their goals for this hospitalization and ongoing recovery are: go home and heal  Post Acute Care Choice Durable Medical Equipment  Living arrangements for the past 2 months Single Family Home  Lives with: Self  Discharge Planning Services CM Consult  DME Agency [employer]  Date DME Agency Contacted 11/27/21  Representative spoke with at [place] granted to share info w [place]  Patient language and need for interpreter reviewed: Yes  Need for Family Participation in Patient Care Y  Appearance: Appears stated age  Attitude/Demeanor/Rapport Engaged  Affect (typically observed) Accepting;Pleasant  Orientation:  Oriented to Self;Oriented to Place;Oriented to  Time;Oriented to Situation   Fuller Mandril, RN, BSN, [place] [phone] Pt qualifies for DME [facility]) bedside commode.  DME  ordered through [facility]. RNCM delivered DME to pt at bedside prior to D/C home.

## 2021-11-27 NOTE — Anesthesia Postprocedure Evaluation (Signed)
Anesthesia Post Note  Patient: Billy Stevens  Procedure(s) Performed: Right Lumbar four to five decompression and discectomy (Right: Spine Lumbar)     Patient location during evaluation: PACU Anesthesia Type: General Level of consciousness: awake and alert Pain management: pain level controlled Vital Signs Assessment: post-procedure vital signs reviewed and stable Respiratory status: spontaneous breathing, nonlabored ventilation, respiratory function stable and patient connected to nasal cannula oxygen Cardiovascular status: blood pressure returned to baseline and stable Postop Assessment: no apparent nausea or vomiting Anesthetic complications: no   No notable events documented.  Last Vitals:  Vitals:   11/27/21 1115 11/27/21 1130  BP: 105/69 108/66  Pulse: 68 72  Resp: 16 14  Temp:    SpO2: 95% 95%    Last Pain:  Vitals:   11/27/21 1130  TempSrc:   PainSc: [place]

## 2021-11-27 NOTE — Discharge Instructions (Signed)

## 2021-11-27 NOTE — Transfer of Care (Signed)
Immediate Anesthesia Transfer of Care Note  Patient: Billy Stevens  Procedure(s) Performed: Right Lumbar four to five decompression and discectomy (Right: Spine Lumbar)  Patient Location: PACU  Anesthesia Type:General  Level of Consciousness: drowsy and patient cooperative  Airway & Oxygen Therapy: Patient Spontanous Breathing and Patient connected to nasal cannula oxygen  Post-op Assessment: Report given to RN, Post -op Vital signs reviewed and stable and Patient moving all extremities  Post vital signs: Reviewed and stable  Last Vitals:  Vitals Value Taken Time  BP 121/75 11/27/21 1030  Temp 36.3 C 11/27/21 1023  Pulse 83 11/27/21 1035  Resp 11 11/27/21 1035  SpO2 99 % 11/27/21 1035  Vitals shown include unvalidated device data.  Last Pain:  Vitals:   11/27/21 0604  TempSrc:   PainSc: 0-No pain         Complications: No notable events documented.

## 2021-11-27 NOTE — Brief Op Note (Signed)
11/27/2021  10:20 AM  PATIENT:  Billy Stevens  56 y.o. male  PRE-OPERATIVE DIAGNOSIS:  Right L4-5 Disc Hernation  POST-OPERATIVE DIAGNOSIS:  Right L4-5 Disc Hernation  PROCEDURE:  Procedure(s) with comments: Right Lumbar four to five decompression and discectomy (Right) - 2.5 hr 3 C-bed  SURGEON:  Surgeon(s) and Role:    Melina Schools, MD - Primary  PHYSICIAN ASSISTANT:   ASSISTANTS: none   ANESTHESIA:   general  EBL:  150 mL   BLOOD ADMINISTERED:none  DRAINS: none   LOCAL MEDICATIONS USED:  MARCAINE    and OTHER exparel  SPECIMEN:  No Specimen  DISPOSITION OF SPECIMEN:  N/A  COUNTS:  YES  TOURNIQUET:  * No tourniquets in log *  DICTATION: .Dragon Dictation  PLAN OF CARE: Discharge to home after PACU  PATIENT DISPOSITION:  PACU - hemodynamically stable.

## 2021-11-27 NOTE — OR Nursing (Signed)
X-ray read by Dr. Ree Edman, DO and called to nurse verifying probe at Lumbar level 4-5.

## 2021-11-27 NOTE — Anesthesia Procedure Notes (Signed)
Procedure Name: Intubation Date/Time: 11/27/2021 7:55 AM  Performed by: Moshe Salisbury, CRNAPre-anesthesia Checklist: Patient identified, Emergency Drugs available, Suction available and Patient being monitored Patient Re-evaluated:Patient Re-evaluated prior to induction Oxygen Delivery Method: Circle System Utilized Preoxygenation: Pre-oxygenation with 100% oxygen Induction Type: IV induction Ventilation: Mask ventilation without difficulty Laryngoscope Size: Mac and 4 Grade View: Grade III Tube type: Oral Tube size: 8.0 mm Number of attempts: 1 Airway Equipment and Method: Stylet Placement Confirmation: ETT inserted through vocal cords under direct vision, positive ETCO2 and breath sounds checked- equal and bilateral Secured at: 24 cm Tube secured with: Tape Dental Injury: Teeth and Oropharynx as per pre-operative assessment

## 2021-11-27 NOTE — Op Note (Signed)
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  ANESTHESIA:   General  EBL: 852 ml   Complications: None  BRIEF HISTORY: Billy Stevens is a 56 y.o. male who presents to my office with severe radicular right leg pain.  Imaging studies demonstrated a large posterior lateral to the right disc herniation at L4-5.  Due to the failure of conservative treatment we elected to move forward with surgery all appropriate risks benefits and alternatives were discussed and consent was obtained  PROCEDURE DETAILS: Patient was brought into the operating room and was properly positioned on the operating room table.  After induction with general anesthesia the patient was endotracheally intubated.  A timeout was taken to confirm all important data: including patient, procedure, and the level. Teds, SCD's were applied.   Patient was placed prone onto the Wilson frame and all bony prominences well-padded.  The back was then prepped and draped in standard fashion.  2 needles were placed into the back and an x-ray was taken for localization of my incision.  The midline incision was infiltrated with quarter percent Marcaine with epinephrine and a incision was made and sharp dissection was carried out down to the deep fascia.  I then injected the paraspinal muscles bilaterally with quarter percent Marcaine with epinephrine with Exparel to aid in postoperative analgesia.  I then stripped the paraspinal muscles to expose the L4 and L5 spinous processes and facet complex.  X-ray was taken with a Penfield 4 underneath the L4 lamina.  At this point I realized that was 1 level cephalad and so I exposed  inferiorly and then placed my Penfield 4 again underneath the L4 lamina and took a second localizing x-ray.  The x-ray x-ray confirmed that I was at the L4 lamina and was also read by the radiologist and confirmed.  Having confirmed the L4-5 level I performed a laminotomy with a 3 mm Kerrison rongeur of L4.  I then dissected through the ligamentum flavum and removed it with my Kerrison rongeur.  I then proceeded into the lateral recess and then remove the medial osteophyte from the facet as well as the ligamentum flavum and the lateral recess.  There were very large epidural veins noted they will all isolated and coagulated with bipolar electrocautery.  I then visualized the very large disc herniation causing significant dorsal displacement of the thecal sac and L5 nerve root.  This was consistent with the preoperative MRI.  I then gently lysed the thecal sac and nerve root medially in order to better expose the disc herniation.  Neuro patties were placed inferiorly and superiorly to aid in hemostasis and retraction of the neural elements.  At this point time an annulotomy was performed and I removed multiple large fragments of disc material.  I then used an Epstein curette to remove some of the osteophyte as well.  After I removed all of the disc material that had herniated down to the thecal sac was no longer dorsally displaced and it was now very mobile.  The L5 nerve root was also very mobile.  Lateral recess had been completely decompressed.  I could easily pass my nerve hook and Woodson elevator superiorly inferiorly out the L5  foramen and medially along the annulus.  At this point I was quite pleased with the overall decompression and discectomy.  When I compare the amount of disc material that had been removed it was equivalent to what was seen on the preoperative MRI.  At this point with the discectomy complete I irrigated the wound copiously normal saline and then used bipolar electrocautery, as well as  Floseal to obtain hemostasis.  After final irrigation I swept circumferentially with the nerve hook under the thecal sac along the annulus and out the L5 foramen and superiorly 1 last time to ensure I had removed all of the disc fragments.  Once I confirmed this I then placed approximately 20 mg (1/2 cc) of Depo-Medrol over the neural elements to aid in postoperative analgesia.  Thrombin-soaked Gelfoam patty was then placed over the laminotomy defect and I removed my retractors.  I then closed the deep fascia with interrupted #1 Vicryl suture.  Then a layer of interrupted 2-0 Vicryl suture, and finally a 3-0 Monocryl for the skin Steri-Strips dry dressing were applied the patient was extubated transfer the PACU the without incident.  The end of the case all needle sponge counts were correct.  Melina Schools, MD 11/27/2021 10:13 AM

## 2021-11-27 NOTE — H&P (Signed)
History:   Billy Stevens is a very pleasant 56 year old gentleman with progressive radicular right leg pain. His clinical exam is consistent with lumbar radiculopathy due to a disc herniation. He has motor deficits and positive nerve root tension signs as well as sensory changes. The MRI clearly shows progression of the disc herniation with new significant neural compression. It is clear that as a result of the lifting event that he has herniated additional disc material and now has neurological deficits and symptomatic radicular leg pain. As a result we are moving forward with a lumbar microdisectomy.  Past Medical History:  Diagnosis Date   Medical history non-contributory     Allergies  Allergen Reactions   Oxycodone Hives    No current facility-administered medications on file prior to encounter.   Current Outpatient Medications on File Prior to Encounter  Medication Sig Dispense Refill   meloxicam (MOBIC) 15 MG tablet Take 1 tablet (15 mg total) by mouth daily. 30 tablet 6   cyclobenzaprine (FLEXERIL) 10 MG tablet Take 1 tablet (10 mg total) by mouth 3 (three) times daily as needed for muscle spasms. (Patient not taking: Reported on 11/19/2021) 30 tablet 0   diclofenac sodium (VOLTAREN) 1 % GEL Apply 4 g topically 4 (four) times daily. (Patient not taking: Reported on 11/19/2021) 4 Tube 2   HYDROcodone-acetaminophen (NORCO/VICODIN) 5-325 MG tablet Take 1 tablet by mouth every 4 (four) hours as needed. (Patient not taking: Reported on 11/19/2021) 20 tablet 0   methocarbamol (ROBAXIN-750) 750 MG tablet Take 1 tablet (750 mg total) by mouth 3 (three) times daily. (Patient not taking: Reported on 11/19/2021) 21 tablet 0   ondansetron (ZOFRAN ODT) 4 MG disintegrating tablet Take 1 tablet (4 mg total) by mouth every 8 (eight) hours as needed for nausea or vomiting. (Patient not taking: Reported on 11/19/2021) 20 tablet 0   predniSONE (DELTASONE) 10 MG tablet Take 6 tablets day one, 5 tablets day two, 4  tablets day three, 3 tablets day four, 2 tablets day five, then 1 tablet day six (Patient not taking: Reported on 11/19/2021) 21 tablet 0    Physical Exam: Vitals:   11/27/21 0551  BP: (!) 151/97  Pulse: 71  Resp: 18  Temp: 98.1 F (36.7 C)  SpO2: 97%   Body mass index is 29.21 kg/m. Billy Stevens is a pleasant individual, who appears younger than their stated age.   He is alert and orientated 3.   No shortness of breath, chest pain. Lungs: Clear to auscultation bilaterally Cardiac: Regular rate and rhythm no rubs gallops murmurs   Abdomen is soft and non-tender, negative loss of bowel and bladder control, no rebound tenderness.   Negative: skin lesions abrasions contusions   Peripheral pulses: 2+ dorsalis pedis/posterior tibialis pulses bilaterally. LE compartments are: Soft and nontender.   Gait pattern: Altered gait pattern due to severe right leg pain   Assistive devices: None   Neuro: 4+/5 right gastrocnemius strength. Positive numbness and dysesthesias in the right foot. Remainder of his neurological exam demonstrates 5/5 motor strength. Normal sensation. Positive straight leg raise test with reproduction of right radicular leg pain. Positive contralateral straight leg raise test. Negative Babinski test, no clonus, 2+ deep tendon reflexes at the knee and Achilles   Musculoskeletal: Moderate low back pain with palpation and range of motion. No SI joint pain. No hip, knee, ankle pain with SI joint range of motion   Lumbar MRI: completed on 09/16/2021: Patient does have transitional anatomy and based  on this full disc space is noted as L5-S1. Based on her numbering there is an L4-5 disc herniation posterior lateral to the right which has enlarged since prior MRI 05/30/2021 there is marked compression of the traversing nerve root.  A/P: Billy Stevens is a very pleasant 56 year old gentleman with progressive radicular right leg pain. His clinical exam is consistent with lumbar radiculopathy  due to a disc herniation. He has motor deficits and positive nerve root tension signs as well as sensory changes. The MRI clearly shows progression of the disc herniation with new significant neural compression. It is clear that as a result of the lifting event that he has herniated additional disc material and now has neurological deficits and symptomatic radicular leg pain.   We have discussed treatment options at this point time I think the best course of action is surgical intervention. I have reviewed the surgery with the patient in great detail and all their questions were addressed.   Surgical plan: Lumbar microdiscectomy at L4-5.   Risks and benefits of decompression/discectomy: Infection, bleeding, death, stroke, paralysis, ongoing or worse pain, need for additional surgery, leak of spinal fluid, adjacent segment degeneration requiring additional surgery, post-operative hematoma formation that can result in neurological compromise and the need for urgent/emergent re-operation. Loss in bowel and bladder control. Injury to major vessels that could result in the need for urgent abdominal surgery to stop bleeding. Risk of deep venous thrombosis (DVT) and the need for additional treatment. Recurrent disc herniation resulting in the need for revision surgery, which could include fusion surgery (utilizing instrumentation such as pedicle screws and intervertebral cages). Additional risk: If instrumentation is used there is a risk of migration, or breakage of that hardware that could require additional surgery.   Patient states he understands the risks and benefits of surgery as well as the goal of surgery. Plan is to help alleviate/hopefully eliminate his radicular leg pain and thereby improve his quality of life.

## 2021-11-28 ENCOUNTER — Encounter (HOSPITAL_COMMUNITY): Payer: Self-pay | Admitting: Orthopedic Surgery

## 2022-01-15 LAB — HM COLONOSCOPY

## 2022-02-25 DIAGNOSIS — R490 Dysphonia: Secondary | ICD-10-CM | POA: Insufficient documentation

## 2022-02-25 DIAGNOSIS — J381 Polyp of vocal cord and larynx: Secondary | ICD-10-CM | POA: Insufficient documentation

## 2022-06-25 DIAGNOSIS — T8579XA Infection and inflammatory reaction due to other internal prosthetic devices, implants and grafts, initial encounter: Secondary | ICD-10-CM | POA: Insufficient documentation

## 2023-08-23 ENCOUNTER — Ambulatory Visit (INDEPENDENT_AMBULATORY_CARE_PROVIDER_SITE_OTHER): Payer: Managed Care, Other (non HMO) | Admitting: Family Medicine

## 2023-08-23 ENCOUNTER — Other Ambulatory Visit (HOSPITAL_BASED_OUTPATIENT_CLINIC_OR_DEPARTMENT_OTHER): Payer: Self-pay

## 2023-08-23 ENCOUNTER — Encounter (HOSPITAL_BASED_OUTPATIENT_CLINIC_OR_DEPARTMENT_OTHER): Payer: Self-pay | Admitting: Family Medicine

## 2023-08-23 VITALS — BP 126/84 | HR 86 | Ht 76.0 in | Wt 240.0 lb

## 2023-08-23 DIAGNOSIS — K625 Hemorrhage of anus and rectum: Secondary | ICD-10-CM | POA: Insufficient documentation

## 2023-08-23 DIAGNOSIS — Z125 Encounter for screening for malignant neoplasm of prostate: Secondary | ICD-10-CM

## 2023-08-23 DIAGNOSIS — E785 Hyperlipidemia, unspecified: Secondary | ICD-10-CM | POA: Insufficient documentation

## 2023-08-23 DIAGNOSIS — Z0000 Encounter for general adult medical examination without abnormal findings: Secondary | ICD-10-CM

## 2023-08-23 MED ORDER — ZOSTER VAC RECOMB ADJUVANTED 50 MCG/0.5ML IM SUSR
0.5000 mL | Freq: Once | INTRAMUSCULAR | 0 refills | Status: AC
  Filled 2023-08-23: qty 0.5, 1d supply, fill #0

## 2023-08-23 NOTE — Progress Notes (Signed)
 New Patient Office Visit  Subjective   Patient ID: Billy Stevens, male    DOB: 1965/09/27  Age: 58 y.o. MRN: [id number]  CC:  Chief Complaint  Patient presents with   Establish Care    Establish Care, prostate check     HPI Billy Stevens presents to establish care Last PCP - Cala Bradford at [facility] - this was through [facility] Physical  Has concerns related to getting prostate checked and recent episode of blood in stool. Blood was noted about 2-3 weeks ago. Recalls an episode in the past, used preparation-H for episode a few years ago. Denies any pain. Notes some straining. Does report having colonoscopy in the past - with Dr. Dulce Sellar at [place]. Does not recall when next one would be due.  Denies FH of prostate cancer.  Had back surgery, has been working on recovery, long process for him.  Does have FH of DM. Brother on dialysis.  Patient is originally from [place]. Does drive trucks commercially. Has son graduating soon from [employer]. He enjoys being outdoors, fishing.  Outpatient Encounter Medications as of 08/23/2023  Medication Sig   azelastine (ASTELIN) 0.1 % nasal spray Place 2 sprays into both nostrils 2 (two) times daily.   famotidine (PEPCID) 20 MG tablet TAKE 1 TABLET BY MOUTH EVERY DAY BEFORE A MEAL   meloxicam (MOBIC) 15 MG tablet Take by mouth.   omeprazole (PRILOSEC) 40 MG capsule Take 40 mg by mouth daily.   [DISCONTINUED] ondansetron (ZOFRAN) 4 MG tablet Take 1 tablet (4 mg total) by mouth every 8 (eight) hours as needed for nausea or vomiting.   No facility-administered encounter medications on file as of 08/23/2023.    Past Medical History:  Diagnosis Date   Medical history non-contributory     Past Surgical History:  Procedure Laterality Date   KNEE ARTHROSCOPY Right    LUMBAR LAMINECTOMY/DECOMPRESSION MICRODISCECTOMY Right 11/27/2021   Procedure: Right Lumbar four to five decompression and discectomy;  Surgeon: Venita Lick, MD;  Location: [facility]  OR;  Service: Orthopedics;  Laterality: Right;  2.5 hr 3 C-bed    History reviewed. No pertinent family history.  Social History   Socioeconomic History   Marital status: Single    Spouse name: Not on file   Number of children: Not on file   Years of education: Not on file   Highest education level: Not on file  Occupational History   Not on file  Tobacco Use   Smoking status: Never    Passive exposure: Never   Smokeless tobacco: Never  Vaping Use   Vaping status: Never Used  Substance and Sexual Activity   Alcohol use: Never    Comment: socially   Drug use: Never   Sexual activity: Not on file  Other Topics Concern   Not on file  Social History Narrative   Not on file   Social Drivers of Health   Financial Resource Strain: Not on file  Food Insecurity: Not on file  Transportation Needs: Not on file  Physical Activity: Not on file  Stress: Not on file  Social Connections: Not on file  Intimate Partner Violence: Not on file    Objective   BP 126/84 (BP Location: Right Arm, Patient Position: Sitting, Cuff Size: Large)   Pulse 86   Ht 6\' 4"  (1.93 m)   Wt 240 lb (108.9 kg)   SpO2 98%   BMI 29.21 kg/m   Physical Exam  58 year old male in no acute distress  Cardiovascular exam with regular rate and rhythm Lungs clear to auscultation bilaterally  Assessment & Plan:   Rectal bleeding Assessment & Plan: No active bleeding currently.  Discussed considerations, can proceed with referral to GI whom patient has seen previously for colon cancer screening.  Orders: -     Ambulatory referral to Gastroenterology  Wellness examination -     CBC with Differential/Platelet; Future -     Comprehensive metabolic panel with GFR; Future -     Hemoglobin A1c; Future -     Lipid panel; Future -     PSA Total (Reflex To Free); Future -     TSH Rfx on Abnormal to Free T4; Future  Prostate cancer screening -     PSA Total (Reflex To Free); Future  Return in about 6  weeks (around 10/04/2023) for CPE with fasting labs 1 week prior.    ___________________________________________ Stirling Orton de [place], MD, ABFM, CAQSM Primary Care and Sports Medicine [facility]

## 2023-08-23 NOTE — Assessment & Plan Note (Signed)
 No active bleeding currently.  Discussed considerations, can proceed with referral to GI whom patient has seen previously for colon cancer screening.

## 2023-08-23 NOTE — Addendum Note (Signed)
 Addended by: DE Peru, Deyanira Fesler J on: 08/23/2023 11:48 AM   Modules accepted: Level of Service

## 2023-08-23 NOTE — Patient Instructions (Signed)
  Medication Instructions:  Your physician recommends that you continue on your current medications as directed. Please refer to the Current Medication list given to you today. --If you need a refill on any your medications before your next appointment, please call your pharmacy first. If no refills are authorized on file call the office.-- Lab Work: Your physician has recommended that you have lab work today: 1 week before next visit  If you have labs (blood work) drawn today and your tests are completely normal, you will receive your results via MyChart message OR a phone call from our staff.  Please ensure you check your voicemail in the event that you authorized detailed messages to be left on a delegated number. If you have any lab test that is abnormal or we need to change your treatment, we will call you to review the results.   Follow-Up: Your next appointment:   Your physician recommends that you schedule a follow-up appointment in: 1-2 months physical with Dr. de Peru  You will receive a text message or e-mail with a link to a survey about your care and experience with [place] today! We would greatly appreciate your feedback!   Thanks for letting us be apart of your health journey!!  Primary Care and Sports Medicine   Dr. Ceasar Mons Peru   We encourage you to activate your patient portal called "MyChart".  Sign up information is provided on this After Visit Summary.  MyChart is used to connect with patients for Virtual Visits (Telemedicine).  Patients are able to view lab/test results, encounter notes, upcoming appointments, etc.  Non-urgent messages can be sent to your provider as well. To learn more about what you can do with MyChart, please visit --  [email or web address].

## 2023-09-03 ENCOUNTER — Other Ambulatory Visit (HOSPITAL_BASED_OUTPATIENT_CLINIC_OR_DEPARTMENT_OTHER): Payer: Self-pay

## 2023-10-21 ENCOUNTER — Other Ambulatory Visit (HOSPITAL_COMMUNITY): Payer: Self-pay | Admitting: Family Medicine

## 2023-10-21 ENCOUNTER — Other Ambulatory Visit (HOSPITAL_BASED_OUTPATIENT_CLINIC_OR_DEPARTMENT_OTHER): Payer: Self-pay | Admitting: *Deleted

## 2023-10-21 DIAGNOSIS — Z0000 Encounter for general adult medical examination without abnormal findings: Secondary | ICD-10-CM

## 2023-10-21 DIAGNOSIS — Z125 Encounter for screening for malignant neoplasm of prostate: Secondary | ICD-10-CM

## 2023-10-22 LAB — COMPREHENSIVE METABOLIC PANEL WITH GFR
ALT: 34 IU/L (ref 0–44)
AST: 40 IU/L (ref 0–40)
Albumin: 4.5 g/dL (ref 3.8–4.9)
Alkaline Phosphatase: 94 IU/L (ref 44–121)
BUN/Creatinine Ratio: 9 (ref 9–20)
BUN: 10 mg/dL (ref 6–24)
Bilirubin Total: 0.6 mg/dL (ref 0.0–1.2)
CO2: 23 mmol/L (ref 20–29)
Calcium: 9.2 mg/dL (ref 8.7–10.2)
Chloride: 104 mmol/L (ref 96–106)
Creatinine, Ser: 1.14 mg/dL (ref 0.76–1.27)
Globulin, Total: 1.7 g/dL (ref 1.5–4.5)
Glucose: 109 mg/dL — ABNORMAL HIGH (ref 70–99)
Potassium: 4.3 mmol/L (ref 3.5–5.2)
Sodium: 139 mmol/L (ref 134–144)
Total Protein: 6.2 g/dL (ref 6.0–8.5)
eGFR: 75 mL/min/{1.73_m2} (ref >59)

## 2023-10-22 LAB — CBC WITH DIFFERENTIAL/PLATELET
Basophils Absolute: 0 10*3/uL (ref 0.0–0.2)
Basos: 1 %
EOS (ABSOLUTE): 0.2 10*3/uL (ref 0.0–0.4)
Eos: 3 %
Hematocrit: 44.2 % (ref 37.5–51.0)
Hemoglobin: 14.2 g/dL (ref 13.0–17.7)
Immature Grans (Abs): 0 10*3/uL (ref 0.0–0.1)
Immature Granulocytes: 0 %
Lymphocytes Absolute: 2.4 10*3/uL (ref 0.7–3.1)
Lymphs: 47 %
MCH: 27.8 pg (ref 26.6–33.0)
MCHC: 32.1 g/dL (ref 31.5–35.7)
MCV: 87 fL (ref 79–97)
Monocytes Absolute: 0.5 10*3/uL (ref 0.1–0.9)
Monocytes: 9 %
Neutrophils Absolute: 2 10*3/uL (ref 1.4–7.0)
Neutrophils: 40 %
Platelets: 276 10*3/uL (ref 150–450)
RBC: 5.1 x10E6/uL (ref 4.14–5.80)
RDW: 13.5 % (ref 11.6–15.4)
WBC: 5.1 10*3/uL (ref 3.4–10.8)

## 2023-10-22 LAB — LIPID PANEL
Chol/HDL Ratio: 9 ratio — ABNORMAL HIGH (ref 0.0–5.0)
Cholesterol, Total: 252 mg/dL — ABNORMAL HIGH (ref 100–199)
HDL: 28 mg/dL — ABNORMAL LOW (ref >39)
LDL Chol Calc (NIH): 176 mg/dL — ABNORMAL HIGH (ref 0–99)
Triglycerides: 252 mg/dL — ABNORMAL HIGH (ref 0–149)
VLDL Cholesterol Cal: 48 mg/dL — ABNORMAL HIGH (ref 5–40)

## 2023-10-22 LAB — HEMOGLOBIN A1C
Est. average glucose Bld gHb Est-mCnc: 131 mg/dL
Hgb A1c MFr Bld: 6.2 % — ABNORMAL HIGH (ref 4.8–5.6)

## 2023-10-22 LAB — PSA TOTAL (REFLEX TO FREE): Prostate Specific Ag, Serum: 0.4 ng/mL (ref 0.0–4.0)

## 2023-10-22 LAB — TSH RFX ON ABNORMAL TO FREE T4: TSH: 1.87 u[IU]/mL (ref 0.450–4.500)

## 2023-10-27 ENCOUNTER — Encounter (HOSPITAL_BASED_OUTPATIENT_CLINIC_OR_DEPARTMENT_OTHER): Payer: Self-pay | Admitting: *Deleted

## 2023-10-27 ENCOUNTER — Encounter (HOSPITAL_BASED_OUTPATIENT_CLINIC_OR_DEPARTMENT_OTHER): Payer: Self-pay | Admitting: Family Medicine

## 2023-10-27 ENCOUNTER — Ambulatory Visit (INDEPENDENT_AMBULATORY_CARE_PROVIDER_SITE_OTHER): Admitting: Family Medicine

## 2023-10-27 VITALS — BP 118/80 | HR 69 | Ht 76.0 in | Wt 234.9 lb

## 2023-10-27 DIAGNOSIS — E785 Hyperlipidemia, unspecified: Secondary | ICD-10-CM

## 2023-10-27 DIAGNOSIS — Z860101 Personal history of adenomatous and serrated colon polyps: Secondary | ICD-10-CM | POA: Insufficient documentation

## 2023-10-27 DIAGNOSIS — R7303 Prediabetes: Secondary | ICD-10-CM | POA: Diagnosis not present

## 2023-10-27 DIAGNOSIS — Z0000 Encounter for general adult medical examination without abnormal findings: Secondary | ICD-10-CM | POA: Insufficient documentation

## 2023-10-27 NOTE — Assessment & Plan Note (Signed)
 Routine HCM labs reviewed. HCM reviewed/discussed. Anticipatory guidance regarding healthy weight, lifestyle and choices given. Recommend healthy diet.  Recommend approximately 150 minutes/week of moderate intensity exercise Recommend regular dental and vision exams Always use seatbelt/lap and shoulder restraints Recommend using smoke alarms and checking batteries at least twice a year Recommend using sunscreen when outside Discussed colon cancer screening recommendations, options.  Patient has had prior colonoscopy with Cherene Core, will request records Discussed immunization recommendations The natural history of prostate cancer and ongoing controversy regarding screening and potential treatment outcomes of prostate cancer has been discussed with the patient. The meaning of a false positive PSA and a false negative PSA has been discussed. He indicates understanding of the limitations of this screening test and wished to proceed with screening PSA testing - testing normal

## 2023-10-27 NOTE — Progress Notes (Signed)
 Subjective:    CC: Annual Physical Exam  HPI: Billy Stevens is a 58 y.o. presenting for annual physical  I reviewed the past medical history, family history, social history, surgical history, and allergies today and no changes were needed.  Please see the problem list section below in epic for further details.  Past Medical History: Past Medical History:  Diagnosis Date   Medical history non-contributory    Past Surgical History: Past Surgical History:  Procedure Laterality Date   KNEE ARTHROSCOPY Right    LUMBAR LAMINECTOMY/DECOMPRESSION MICRODISCECTOMY Right 11/27/2021   Procedure: Right Lumbar four to five decompression and discectomy;  Surgeon: Mort Ards, MD;  Location: [facility] OR;  Service: Orthopedics;  Laterality: Right;  2.5 hr 3 C-bed   Social History: Social History   Socioeconomic History   Marital status: Single    Spouse name: Not on file   Number of children: Not on file   Years of education: Not on file   Highest education level: Not on file  Occupational History   Not on file  Tobacco Use   Smoking status: Never    Passive exposure: Never   Smokeless tobacco: Never  Vaping Use   Vaping status: Never Used  Substance and Sexual Activity   Alcohol use: Never    Comment: socially   Drug use: Never   Sexual activity: Not on file  Other Topics Concern   Not on file  Social History Narrative   Not on file   Social Drivers of Health   Financial Resource Strain: Low Risk  (10/27/2023)   Overall Financial Resource Strain (CARDIA)    Difficulty of Paying Living Expenses: Not hard at all  Food Insecurity: No Food Insecurity (10/27/2023)   Hunger Vital Sign    Worried About Running Out of Food in the Last Year: Never true    Ran Out of Food in the Last Year: Never true  Transportation Needs: No Transportation Needs (10/27/2023)   PRAPARE - Administrator, Civil Service (Medical): No    Lack of Transportation (Non-Medical): No  Physical  Activity: Sufficiently Active (10/27/2023)   Exercise Vital Sign    Days of Exercise per Week: 4 days    Minutes of Exercise per Session: 60 min  Stress: No Stress Concern Present (10/27/2023)   [employer] of Occupational Health - Occupational Stress Questionnaire    Feeling of Stress : Not at all  Social Connections: Moderately Isolated (10/27/2023)   Social Connection and Isolation Panel [NHANES]    Frequency of Communication with Friends and Family: More than three times a week    Frequency of Social Gatherings with Friends and Family: Three times a week    Attends Religious Services: More than 4 times per year    Active Member of Clubs or Organizations: No    Attends Banker Meetings: Never    Marital Status: Divorced   Family History: History reviewed. No pertinent family history. Allergies: Allergies  Allergen Reactions   Hydrocodone  Nausea And Vomiting   Oxycodone  Hives   Medications: See med rec.  Review of Systems: No headache, visual changes, nausea, vomiting, diarrhea, constipation, dizziness, abdominal pain, skin rash, fevers, chills, night sweats, swollen lymph nodes, weight loss, chest pain, body aches, joint swelling, muscle aches, shortness of breath, mood changes, visual or auditory hallucinations.  Objective:    BP 118/80 (BP Location: Left Arm, Patient Position: Sitting, Cuff Size: Large)   Pulse 69   Ht 6' 4 (1.93  m)   Wt 234 lb 14.4 oz (106.5 kg)   SpO2 98%   BMI 28.59 kg/m   General: Well Developed, well nourished, and in no acute distress.  Neuro: Alert and oriented x3, extra-ocular muscles intact, sensation grossly intact. Cranial nerves II through XII are intact, motor, sensory, and coordinative functions are all intact. HEENT: Normocephalic, atraumatic, pupils equal round reactive to light, neck supple, no masses, no lymphadenopathy, thyroid nonpalpable. Oropharynx, nasopharynx, external ear canals are unremarkable. Skin: Warm and  dry, no rashes noted. Cardiac: Regular rate and rhythm, no murmurs rubs or gallops. Respiratory: Clear to auscultation bilaterally. Not using accessory muscles, speaking in full sentences. Abdominal: Soft, nontender, nondistended, positive bowel sounds, no masses, no organomegaly.  Musculoskeletal: Shoulder, elbow, wrist, hip, knee, ankle stable, and with full range of motion.  Impression and Recommendations:    Wellness examination Assessment & Plan: Routine HCM labs reviewed. HCM reviewed/discussed. Anticipatory guidance regarding healthy weight, lifestyle and choices given. Recommend healthy diet.  Recommend approximately 150 minutes/week of moderate intensity exercise Recommend regular dental and vision exams Always use seatbelt/lap and shoulder restraints Recommend using smoke alarms and checking batteries at least twice a year Recommend using sunscreen when outside Discussed colon cancer screening recommendations, options.  Patient has had prior colonoscopy with [facility], will request records Discussed immunization recommendations The natural history of prostate cancer and ongoing controversy regarding screening and potential treatment outcomes of prostate cancer has been discussed with the patient. The meaning of a false positive PSA and a false negative PSA has been discussed. He indicates understanding of the limitations of this screening test and wished to proceed with screening PSA testing - testing normal   Prediabetes Assessment & Plan: Observed on recent labs, reviewed with patient today.  We will plan to recheck A1c shortly before next appointment.  Discussed lifestyle modifications, handout provided.  Orders: -     Hemoglobin A1c; Future  Hyperlipidemia, unspecified hyperlipidemia type Assessment & Plan: Observed on recent labs.  Reviewed with patient today.  Current ASCVD risk score is 8.3%.  We discussed lifestyle modifications, handout provided.  We also discussed  process and consideration for CAC scoring.  He will initially focus on lifestyle modifications, we will recheck cholesterol numbers shortly before next appointment.  Can consider proceeding with CAC scoring at patient preference  Orders: -     Lipid panel; Future  Return in about 6 months (around 04/27/2024) for prediabetes, hyperlipidemia.   ___________________________________________ Jobeth Pangilinan de [place], MD, ABFM, [facility] Primary Care and Sports Medicine [facility]

## 2023-10-27 NOTE — Assessment & Plan Note (Signed)
 Observed on recent labs, reviewed with patient today.  We will plan to recheck A1c shortly before next appointment.  Discussed lifestyle modifications, handout provided.

## 2023-10-27 NOTE — Assessment & Plan Note (Signed)
 Observed on recent labs.  Reviewed with patient today.  Current ASCVD risk score is 8.3%.  We discussed lifestyle modifications, handout provided.  We also discussed process and consideration for CAC scoring.  He will initially focus on lifestyle modifications, we will recheck cholesterol numbers shortly before next appointment.  Can consider proceeding with CAC scoring at patient preference

## 2023-10-27 NOTE — Patient Instructions (Signed)
  Medication Instructions:  Your physician recommends that you continue on your current medications as directed. Please refer to the Current Medication list given to you today. --If you need a refill on any your medications before your next appointment, please call your pharmacy first. If no refills are authorized on file call the office.-- Lab Work: Your physician has recommended that you have lab work today: 1 week before next visit  If you have labs (blood work) drawn today and your tests are completely normal, you will receive your results via MyChart message OR a phone call from our staff.  Please ensure you check your voicemail in the event that you authorized detailed messages to be left on a delegated number. If you have any lab test that is abnormal or we need to change your treatment, we will call you to review the results.   Follow-Up: Your next appointment:   Your physician recommends that you schedule a follow-up appointment in: 4-6 month follow up  with Dr. de Peru  You will receive a text message or e-mail with a link to a survey about your care and experience with [place] today! We would greatly appreciate your feedback!   Thanks for letting us be apart of your health journey!!  Primary Care and Sports Medicine   Dr. Ceasar Mons [place]   We encourage you to activate your patient portal called "MyChart".  Sign up information is provided on this After Visit Summary.  MyChart is used to connect with patients for Virtual Visits (Telemedicine).  Patients are able to view lab/test results, encounter notes, upcoming appointments, etc.  Non-urgent messages can be sent to your provider as well. To learn more about what you can do with MyChart, please visit --  [email or web address].

## 2023-11-01 ENCOUNTER — Other Ambulatory Visit (HOSPITAL_BASED_OUTPATIENT_CLINIC_OR_DEPARTMENT_OTHER): Payer: Self-pay

## 2023-11-01 MED ORDER — SHINGRIX 50 MCG/0.5ML IM SUSR
INTRAMUSCULAR | 0 refills | Status: DC
  Filled 2023-11-01: qty 1, 1d supply, fill #0

## 2023-11-02 ENCOUNTER — Other Ambulatory Visit: Payer: Self-pay

## 2024-04-28 ENCOUNTER — Encounter (HOSPITAL_BASED_OUTPATIENT_CLINIC_OR_DEPARTMENT_OTHER): Payer: Self-pay | Admitting: Family Medicine

## 2024-04-28 ENCOUNTER — Ambulatory Visit (INDEPENDENT_AMBULATORY_CARE_PROVIDER_SITE_OTHER): Admitting: Family Medicine

## 2024-04-28 VITALS — BP 122/87 | HR 74 | Temp 98.3°F | Resp 18 | Ht 76.0 in | Wt 234.0 lb

## 2024-04-28 DIAGNOSIS — R7303 Prediabetes: Secondary | ICD-10-CM | POA: Diagnosis not present

## 2024-04-28 DIAGNOSIS — M545 Low back pain, unspecified: Secondary | ICD-10-CM | POA: Diagnosis not present

## 2024-04-28 DIAGNOSIS — E785 Hyperlipidemia, unspecified: Secondary | ICD-10-CM

## 2024-04-28 LAB — LIPID PANEL
Chol/HDL Ratio: 8.1 ratio — ABNORMAL HIGH (ref 0.0–5.0)
Cholesterol, Total: 252 mg/dL — ABNORMAL HIGH (ref 100–199)
HDL: 31 mg/dL — ABNORMAL LOW (ref >39)
LDL Chol Calc (NIH): 188 mg/dL — ABNORMAL HIGH (ref 0–99)
Triglycerides: 173 mg/dL — ABNORMAL HIGH (ref 0–149)
VLDL Cholesterol Cal: 33 mg/dL (ref 5–40)

## 2024-04-28 LAB — HEMOGLOBIN A1C
Est. average glucose Bld gHb Est-mCnc: 128 mg/dL
Hgb A1c MFr Bld: 6.1 % — ABNORMAL HIGH (ref 4.8–5.6)

## 2024-04-28 MED ORDER — MELOXICAM 15 MG PO TABS: 15.0000 mg | ORAL_TABLET | Freq: Every day | ORAL | 0 refills | Status: AC

## 2024-04-28 NOTE — Progress Notes (Signed)
° ° °  Procedures performed today:    None.  Independent interpretation of notes and tests performed by another provider:   None.  Brief History, Exam, Impression, and Recommendations:    BP 122/87 (BP Location: Right Arm, Patient Position: Sitting, Cuff Size: Large)   Pulse 74   Temp 98.3 F (36.8 C) (Oral)   Resp 18   Ht 6' 4 (1.93 m)   Wt 234 lb (106.1 kg)   SpO2 96%   BMI 28.48 kg/m   Hyperlipidemia, unspecified hyperlipidemia type Assessment & Plan: Patient fasting this morning.  We will proceed with recheck of cholesterol panel today.  Recommend continuing with lifestyle modifications.  We will reassess risk with updated cholesterol numbers  Orders: -     Lipid panel  Prediabetes Assessment & Plan: Due for recheck of A1c for monitoring, we will check this today.  No new symptoms suggestive of overt diabetes such as polyuria or polydipsia.  Can continue with lifestyle modifications, intermittent monitoring of A1c  Orders: -     Hemoglobin A1c  Bilateral low back pain, unspecified chronicity, unspecified whether sciatica present Assessment & Plan: Patient notes that he has generally been doing well, occasional health error.  He is requesting refill of meloxicam  today to help with control of symptoms.  Has not had any recent follow-up with spine specialist as he generally has been doing well on prior procedure.  Has met with chiropractor on occasion. Can continue with conservative measures.  If back pain is progressing, would recommend reevaluation with spine specialist, previously has worked with [facility]   Other orders -     Meloxicam ; Take 1 tablet (15 mg total) by mouth daily.  Dispense: 90 tablet; Refill: 0  Return in about 6 months (around 10/27/2024) for CPE with fasting labs 1 week prior.   ___________________________________________ Nashaly Dorantes de [place], MD, ABFM, CAQSM Primary Care and Sports Medicine [facility]

## 2024-04-28 NOTE — Assessment & Plan Note (Signed)
 Patient notes that he has generally been doing well, occasional health error.  He is requesting refill of meloxicam  today to help with control of symptoms.  Has not had any recent follow-up with spine specialist as he generally has been doing well on prior procedure.  Has met with chiropractor on occasion. Can continue with conservative measures.  If back pain is progressing, would recommend reevaluation with spine specialist, previously has worked with [facility]

## 2024-04-28 NOTE — Assessment & Plan Note (Signed)
 Due for recheck of A1c for monitoring, we will check this today.  No new symptoms suggestive of overt diabetes such as polyuria or polydipsia.  Can continue with lifestyle modifications, intermittent monitoring of A1c

## 2024-04-28 NOTE — Assessment & Plan Note (Signed)
 Patient fasting this morning.  We will proceed with recheck of cholesterol panel today.  Recommend continuing with lifestyle modifications.  We will reassess risk with updated cholesterol numbers

## 2024-05-04 DIAGNOSIS — Z125 Encounter for screening for malignant neoplasm of prostate: Secondary | ICD-10-CM

## 2024-05-04 DIAGNOSIS — Z0000 Encounter for general adult medical examination without abnormal findings: Secondary | ICD-10-CM

## 2024-06-13 ENCOUNTER — Ambulatory Visit (INDEPENDENT_AMBULATORY_CARE_PROVIDER_SITE_OTHER): Admitting: Family Medicine

## 2024-06-13 ENCOUNTER — Ambulatory Visit (HOSPITAL_BASED_OUTPATIENT_CLINIC_OR_DEPARTMENT_OTHER): Payer: Self-pay | Admitting: Family Medicine

## 2024-06-13 ENCOUNTER — Ambulatory Visit: Payer: Self-pay

## 2024-06-13 ENCOUNTER — Telehealth (HOSPITAL_BASED_OUTPATIENT_CLINIC_OR_DEPARTMENT_OTHER): Payer: Self-pay | Admitting: *Deleted

## 2024-06-13 ENCOUNTER — Encounter (HOSPITAL_BASED_OUTPATIENT_CLINIC_OR_DEPARTMENT_OTHER): Payer: Self-pay | Admitting: Family Medicine

## 2024-06-13 ENCOUNTER — Ambulatory Visit (INDEPENDENT_AMBULATORY_CARE_PROVIDER_SITE_OTHER)

## 2024-06-13 VITALS — BP 114/79 | HR 91 | Temp 98.3°F | Ht 76.0 in | Wt 228.8 lb

## 2024-06-13 DIAGNOSIS — B349 Viral infection, unspecified: Secondary | ICD-10-CM

## 2024-06-13 DIAGNOSIS — R1110 Vomiting, unspecified: Secondary | ICD-10-CM

## 2024-06-13 LAB — POCT INFLUENZA A/B
Influenza A, POC: NEGATIVE
Influenza B, POC: NEGATIVE

## 2024-06-13 MED ORDER — ONDANSETRON 4 MG PO TBDP: 4.0000 mg | ORAL_TABLET | Freq: Three times a day (TID) | ORAL | 0 refills | Status: AC | PRN

## 2024-06-13 MED ORDER — LOPERAMIDE HCL 2 MG PO TABS: 2.0000 mg | ORAL_TABLET | Freq: Four times a day (QID) | ORAL | 0 refills | Status: AC | PRN

## 2024-06-13 NOTE — Telephone Encounter (Signed)
 Copied from CRM #[id number]. Topic: Clinical - Medication Question >> Jun 13, 2024  2:20 PM Donna BRAVO wrote: Reason for CRM: Patient is at the pharmacy, they are saying a prio auth from provider is needed.

## 2024-06-13 NOTE — Progress Notes (Signed)
 "     Acute Care Office Visit  Subjective:   Billy Stevens [birth year] 06/13/2024  Chief Complaint  Patient presents with   Diarrhea    Pt began having vomiting and diarrhea which began about 3 days ago. Also has had a dry mouth, will become hot and sweaty and also has felt like he was going to pass out.    HPI: Patient states he began having symptoms of severe fatigue, bodyaches, vomiting and diarrhea approx. 3 days ago on Sunday night with chills, fever, headache with severe fatigue. He is a naval architect for occupation.   Denies known exposure but travels frequently for work. He states symptoms are slowly improving from time of onset. He is using ibuprofen and has been able to eat and drink. Reports vomiting has subsided today. He states he is having 3-4 episodes of diarrhea today. Reports mild cough and mild SHOB with chest wall pain. Reports significant relapsing fever and worsening fatigue.    The following portions of the patient's history were reviewed and updated as appropriate: past medical history, past surgical history, family history, social history, allergies, medications, and problem list.   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 No pertinent family history. Outpatient Medications Prior to Visit  Medication Sig Dispense Refill   azelastine (ASTELIN) 0.1 % nasal spray Place 2 sprays into both nostrils 2 (two) times daily.     famotidine (PEPCID) 20 MG tablet TAKE 1 TABLET BY MOUTH EVERY DAY BEFORE A MEAL     meloxicam  (MOBIC ) 15 MG tablet Take 1 tablet (15 mg total) by mouth daily. 90 tablet 0   omeprazole (PRILOSEC) 40 MG capsule Take 40 mg by mouth daily.     No facility-administered medications prior to visit.   Allergies[1]   ROS: A complete ROS was performed with pertinent positives/negatives noted in the HPI. The remainder of the ROS are negative.    Objective:   Today's Vitals   06/13/24 1308  BP: 114/79  Pulse: 91  Temp: 98.3 F (36.8 C)  TempSrc: Oral  SpO2: 97%  Weight: 228 lb 12.8 oz (103.8 kg)  Height: 6' 4 (1.93 m)    GENERAL: Ill-appearing, in NAD. Well nourished.  SKIN: Pink, warm and dry.  Head: Normocephalic. NECK: Trachea midline. Full ROM w/o pain or tenderness. No lymphadenopathy.  EARS: Tympanic membranes are intact, translucent without bulging and without drainage. Appropriate landmarks visualized.  EYES: Conjunctiva clear without exudates. EOMI, PERRL, no drainage present.  NOSE: Septum midline w/o deformity. Nares patent, mucosa pink and non-inflamed w/o drainage. No sinus tenderness.  THROAT: Uvula midline. Oropharynx clear. Mucous membranes pink and moist.  RESPIRATORY: Chest wall symmetrical. Respirations even and non-labored. Breath sounds clear to auscultation  bilaterally. Cough dry.  CARDIAC: S1, S2 present, regular rate and rhythm without murmur or gallops. Peripheral pulses 2+ bilaterally.  GI: Abdomen soft, non-tender. Normoactive bowel sounds. No rebound tenderness. No hepatomegaly or splenomegaly. No CVA tenderness.  MSK: Muscle tone and strength appropriate for age.  NEUROLOGIC: No motor or sensory deficits. Steady, even gait.  C2-C12 intact.  PSYCH/MENTAL STATUS: Alert, oriented x 3. Cooperative, appropriate mood and affect.    Results for orders placed or performed in visit on 06/13/24  POCT Influenza A/B  Result Value Ref Range   Influenza A, POC Negative Negative   Influenza B, POC Negative Negative      Assessment & Plan:  1. Viral infection (Primary) Negative for flu in office. Given SHOB with congestion and fatigue, will rule out CAP with chest xray. As patient's symptoms are improving, I discussed likely viral illness. Recommend use of Zofran  and immodium as directed and increase clear fluid intake with rest. If symptoms worsening or no improvement in 48 hours, reach out to PCP. Work note provided.   - DG Chest 2 View; Future - ondansetron  (ZOFRAN -ODT) 4 MG disintegrating tablet; Take 1 tablet (4 mg total) by mouth every 8 (eight) hours as needed for nausea or vomiting.  Dispense: 20 tablet; Refill: 0 - loperamide  (IMODIUM  A-D) 2 MG tablet; Take 1 tablet (2 mg total) by mouth 4 (four) times daily as needed for diarrhea or loose stools.  Dispense: 30 tablet; Refill: 0   Meds ordered this encounter  Medications   ondansetron  (ZOFRAN -ODT) 4 MG disintegrating tablet    Sig: Take 1 tablet (4 mg total) by mouth every 8 (eight) hours as needed for nausea or vomiting.    Dispense:  20 tablet    Refill:  0    Supervising Provider:   DE CUBA, RAYMOND J [[id number]]   loperamide  (IMODIUM  A-D) 2 MG tablet    Sig: Take 1 tablet (2 mg total) by mouth 4 (four) times daily as needed for diarrhea or loose stools.    Dispense:  30 tablet    Refill:  0    Supervising Provider:   DE CUBA, RAYMOND J [[id number]]   Lab Orders         POCT Influenza A/B      Return if symptoms worsen or fail to improve.    Patient to reach out to office if new, worrisome, or unresolved symptoms arise or if no improvement in patient's condition. Patient verbalized understanding and is agreeable to treatment plan. All questions answered to  patient's satisfaction.    Billy Schuyler Stark, FNP      [1]  Allergies Allergen Reactions   Hydrocodone  Nausea And Vomiting   Oxycodone  Hives   "

## 2024-06-13 NOTE — Progress Notes (Signed)
 Hi [name],  Your xray is negative for signs of pneumonia. Please continue as advised.

## 2024-06-13 NOTE — Patient Instructions (Signed)
 Stay home and away from others until symptoms are improving and fever free for 24 hours without the use of tylenol  or ibuprofen. If symptoms have improved and fever resolved, then can be in public setting, but should wear a mask around others, practice safe distancing, and perform hand hygiene for an additional 5 days. If symptoms worsen, or the patient develop shortness of breath, pulse oximeter reading of < 90%, or chest pain, seek immediate care at nearest emergency department or call 911.   Vitamin Regimen:  Vitamin C 500mg  twice daily  Vitamin D 5000 units once daily  Zinc 50-75mg  once daily   Over the counter Medications:  Use Tylenol  (acetaminophen ) or Ibuprofen (Advil) for fever *unless allergic or contraindicated*    Non-Medication Therapy:  Drink plenty of fluids, warm if possible.   A teaspoon of honey may help ease coughing symptoms.   Cough drops or hard candy for coughing.   Over the Counter Medication Therapy:  Use a cough expectorant such as guaifenesin (Mucinex) if recommended by your doctor for a wet, congested cough. If you have high blood pressure, please ask your doctor first before using this.   Use a cough suppressant such as dextromethorphan (Robitussin/Delsym) for a dry cough. If you have high blood pressure, please ask your doctor first before using this.   If you have high blood pressure, medication such as Coricidin HBP is safe to take for your cough and will not increase your blood pressure.

## 2024-06-13 NOTE — Telephone Encounter (Signed)
 FYI Only or Action Required?: FYI only for provider: appointment scheduled on 06/13/24.  Patient was last seen in primary care on 04/28/2024 by de Cuba, Quintin PARAS, MD.  Called Nurse Triage reporting Emesis.  Symptoms began several days ago.  Interventions attempted: Rest, hydration, or home remedies.  Symptoms are: unchanged.  Triage Disposition: See Physician Within 24 Hours  Patient/caregiver understands and will follow disposition?: Yes  Reason for Disposition  [1] MILD or MODERATE vomiting AND [2] present > 48 hours (2 days)  (Exception: Mild vomiting with associated diarrhea.)  Answer Assessment - Initial Assessment Questions Pt reports emesis, diarrhea, possible fever, sweating that began on the evening of Jan 25. Reports diarrhea without hematochezia or melena.   1. VOMITING SEVERITY: How many times have you vomited in the past 24 hours?      4-5 2. ONSET: When did the vomiting begin?      Sunday 06/11/24 3. FLUIDS: What fluids or food have you vomited up today? Have you been able to keep any fluids down?     States can tolerate fluids 4. ABDOMEN PAIN: Are your having any abdomen pain? If Yes : How bad is it and what does it feel like? (e.g., crampy, dull, intermittent, constant)      Only prior to passing stool or vomiting 5. DIARRHEA: Is there any diarrhea? If Yes, ask: How many times today?      Yes, unable to count 7. CAUSE: What do you think is causing your vomiting?     Unknown 8. HYDRATION STATUS: Any signs of dehydration? (e.g., dry mouth [not only dry lips], too weak to stand) When did you last urinate?     States was weak on 06/12/24, but no longer. States urinating more frequently than normal  Protocols used: Vomiting-A-AH  Message from [place] S sent at 06/13/2024  8:05 AM EST  Reason for Triage: fever, vomiting, diarrhea, fatigue

## 2024-06-13 NOTE — Telephone Encounter (Signed)
 Called pt's pharmacy to see which med is requiring a prior authorization. Was placed on  hold 5 times by pharmacy prior to anyone ever coming to the phone and then received a message stating due to technical difficulties the call could not be completed at this time.  Called back and was able to speak with the pharmacy staff and was told that the zofran  was requiring a prior authorization unless this was just a one time fill for 30-day supply and then it would be about $3 for pt. Told pharmacy that it would be a one time fill and they stated they would get it ready for pt.
